# Patient Record
Sex: Female | Born: 1980 | Race: White | Hispanic: No | Marital: Married | Smoking: Never smoker
Health system: Southern US, Community
[De-identification: ages and names within clinical notes are randomized; demographics above are authoritative.]

## PROBLEM LIST (undated history)

## (undated) DIAGNOSIS — F419 Anxiety disorder, unspecified: Secondary | ICD-10-CM

## (undated) DIAGNOSIS — F329 Major depressive disorder, single episode, unspecified: Secondary | ICD-10-CM

## (undated) DIAGNOSIS — F32A Depression, unspecified: Secondary | ICD-10-CM

## (undated) DIAGNOSIS — Z862 Personal history of diseases of the blood and blood-forming organs and certain disorders involving the immune mechanism: Secondary | ICD-10-CM

## (undated) DIAGNOSIS — O009 Unspecified ectopic pregnancy without intrauterine pregnancy: Secondary | ICD-10-CM

## (undated) DIAGNOSIS — D689 Coagulation defect, unspecified: Secondary | ICD-10-CM

## (undated) HISTORY — DX: Personal history of diseases of the blood and blood-forming organs and certain disorders involving the immune mechanism: Z86.2

## (undated) HISTORY — DX: Coagulation defect, unspecified: D68.9

## (undated) HISTORY — PX: OTHER SURGICAL HISTORY: SHX169

---

## 2011-05-20 ENCOUNTER — Emergency Department: Payer: Self-pay | Admitting: Internal Medicine

## 2011-07-04 ENCOUNTER — Emergency Department: Payer: Self-pay | Admitting: Emergency Medicine

## 2011-07-04 LAB — COMPREHENSIVE METABOLIC PANEL
Albumin: 4 g/dL (ref 3.4–5.0)
BUN: 12 mg/dL (ref 7–18)
Bilirubin,Total: 0.4 mg/dL (ref 0.2–1.0)
Chloride: 103 mmol/L (ref 98–107)
Creatinine: 0.54 mg/dL — ABNORMAL LOW (ref 0.60–1.30)
EGFR (Non-African Amer.): >60
Glucose: 94 mg/dL (ref 65–99)
Osmolality: 275 (ref 275–301)
Potassium: 3.7 mmol/L (ref 3.5–5.1)
SGOT(AST): 16 U/L (ref 15–37)
SGPT (ALT): 23 U/L
Sodium: 138 mmol/L (ref 136–145)

## 2011-07-04 LAB — URINALYSIS, COMPLETE
Bacteria: NONE SEEN
Glucose,UR: NEGATIVE mg/dL (ref 0–75)
Ketone: NEGATIVE
Leukocyte Esterase: NEGATIVE
Nitrite: NEGATIVE
Protein: NEGATIVE
RBC,UR: <1 /HPF (ref 0–5)
Specific Gravity: 1.008 (ref 1.003–1.030)
Squamous Epithelial: <1

## 2011-07-04 LAB — PREGNANCY, URINE: Pregnancy Test, Urine: POSITIVE m[IU]/mL

## 2011-07-04 LAB — CBC
HCT: 37.4 % (ref 35.0–47.0)
HGB: 12.4 g/dL (ref 12.0–16.0)
MCH: 30 pg (ref 26.0–34.0)
MCHC: 33.1 g/dL (ref 32.0–36.0)
Platelet: 219 10*3/uL (ref 150–440)
RDW: 13.6 % (ref 11.5–14.5)

## 2011-07-04 LAB — HCG, QUANTITATIVE, PREGNANCY: Beta Hcg, Quant.: 1316 m[IU]/mL — ABNORMAL HIGH

## 2011-07-09 ENCOUNTER — Ambulatory Visit: Payer: Self-pay | Admitting: Obstetrics and Gynecology

## 2011-07-09 HISTORY — PX: LAPAROSCOPIC OVARIAN CYSTECTOMY: SUR786

## 2011-07-14 LAB — PATHOLOGY REPORT

## 2011-11-04 ENCOUNTER — Emergency Department: Payer: Self-pay | Admitting: Emergency Medicine

## 2012-04-19 ENCOUNTER — Observation Stay: Payer: Self-pay | Admitting: Obstetrics & Gynecology

## 2012-04-19 LAB — RUPTURE OF MEMBRANE PLUS: Rom Plus: NOT DETECTED

## 2012-04-20 ENCOUNTER — Inpatient Hospital Stay: Payer: Self-pay | Admitting: Obstetrics and Gynecology

## 2012-04-20 LAB — CBC WITH DIFFERENTIAL/PLATELET
Basophil #: 0.1 10*3/uL (ref 0.0–0.1)
HCT: 35.6 % (ref 35.0–47.0)
Lymphocyte %: 13.7 %
MCH: 31.3 pg (ref 26.0–34.0)
MCV: 92 fL (ref 80–100)
Neutrophil #: 11.6 10*3/uL — ABNORMAL HIGH (ref 1.4–6.5)
Neutrophil %: 81.6 %
Platelet: 212 10*3/uL (ref 150–440)
RDW: 13.2 % (ref 11.5–14.5)
WBC: 14.3 10*3/uL — ABNORMAL HIGH (ref 3.6–11.0)

## 2012-04-21 LAB — HEMATOCRIT: HCT: 29.3 % — ABNORMAL LOW

## 2012-07-05 DIAGNOSIS — F4321 Adjustment disorder with depressed mood: Secondary | ICD-10-CM | POA: Insufficient documentation

## 2014-05-20 NOTE — Consult Note (Signed)
Brief Consult Note: Diagnosis: Pregnancy of undetermined location.   Patient was seen by consultant.   Consult note dictated.   Recommend further assessment or treatment.   Discussed with Attending MD.   Comments: 34 yo G1 at 2025w6d by sure LMP of 05/11/2011 presenting with vaginal bleeding and cramping - TVUS showing no IUP, with read showing possible adnexal mass/cyst, BHCG below discriminatory zone - After discussion of managment options with patients including sugical intervention we will proceed with repeat BHCG in 48-hrs and repeat TVUS.  Electronic Signatures: Lorrene ReidStaebler, Martez Weiand M (MD)  (Signed 09-Jun-13 03:59)  Authored: Brief Consult Note   Last Updated: 09-Jun-13 03:59 by Lorrene ReidStaebler, Bakary Bramer M (MD)

## 2014-05-20 NOTE — Op Note (Signed)
PATIENT NAME:  Beverly Daniels, Beverly Daniels MR#:  [id number] DATE OF BIRTH:  November 23, 1980  DATE OF PROCEDURE:  07/09/2011  PREOPERATIVE DIAGNOSES: Left ectopic pregnancy and possible left ovarian cyst.   POSTOPERATIVE DIAGNOSES: Left ectopic pregnancy and possible left ovarian cyst.  PROCEDURE PERFORMED: Laparoscopic left salpingectomy and left ovarian cystectomy.   PRIMARY SURGEON: Florina Ou. Bonney Aid, MD  ASSISTANT: Dr. Senaida Lange   ANESTHESIA: General.   ESTIMATED BLOOD LOSS: Minimal.   IV FLUIDS: 700 mL of crystalloid.   URINE OUTPUT: 50 mL of clear urine straight catheter prior to beginning of the case.   SPECIMENS REMOVED: Portion of left tube containing ectopic pregnancy as well as left ovarian cyst wall.   IMPLANTS: None.   DRAINS OR TUBES: None.   INTRAOPERATIVE FINDINGS: Large left fallopian tube ectopic pregnancy approximately 5 x 6 cm. A large simple left ovarian cyst as well as two smaller cysts, one with sebaceous material likely representing a functional or serous cyst as well as possible early dermoid. There was no evidence of hemoperitoneum at the time of surgery.   PATIENT CONDITION FOLLOWING PROCEDURE: Stable.   PROCEDURE IN DETAIL: Risks, benefits, and alternatives of the procedure were discussed with the patient prior to proceeding to the Operating Room. The patient was taken to the Operating Room, placed under general endotracheal anesthesia. She was positioned in the dorsal lithotomy position and prepped and draped in the usual sterile fashion. Prior to beginning of the case the patient's bladder was emptied for approximately 50 mL of clear urine using a straight catheter. A sterile speculum was then placed. The anterior lip of the cervix was grasped with a single-tooth tenaculum and a Hulka tenaculum was then placed in the patient's uterus. Following this the single-tooth tenaculum was removed as was the speculum and attention was turned to the patient's abdomen. A  5 mm stab incision was made at the base of the umbilicus and a 5 mm XL trocar was then used to gain entry into the peritoneal cavity under direct visualization. Once the peritoneal cavity had been entered, insufflation was begun. A 5 mm left lateral assistant port was then placed under direct visualization as well as a 10 mm right lateral assistant port. Inspection of the abdomen revealed a large left fallopian tube ectopic pregnancy which involved almost the entire fallopian tube. The ectopic was nonruptured although and there was no noted hemoperitoneum at the time of entry. Given the size of the ectopic it was decided to proceed with salpingectomy. Salpingectomy was performed using a 5 mm Harmonic device. Following removal of the left tube and ectopic the pedicles were inspected and noted to be hemostatic. The remainder of the pelvic anatomy as well as abdominal anatomy was inspected. The liver edge was grossly normal. There is no evidence of [employer] or endometriosis lesions. The right tube and ovary appeared grossly normal with normal fimbria noted on the right tube. The left ovary was then inspected closer and noted to contain a large simple-appearing cyst approximately 4 cm in diameter. The cyst was incised using a 5 mm LigaSure device. The cyst was noted to drain clear serous fluid. The cyst wall was then dissected and excised. Following removal of the left ovarian cyst wall two smaller cysts were noted which were incised and drained, one of which drained sebaceous-looking material likely representing an early dermoid. At this point an EndoCatch bag was deployed through the 10 mm right assistant port. The ectopic pregnancy was removed using the EndoCatch bag.  Following removal of the ectopic the  pelvis was irrigated and all pedicles were reinspected and noted to be hemostatic. The ovarian fossa was also inspected and noted to be hemostatic. Next, the 10 mm port site was closed using a laparoscopic  closure device. Following closure of the 10 mm port pneumoperitoneum was evacuated and the left assistant ports were removed under direct visualization. The 10 mm port site was closed at the level of the skin with a 4-0 Monocryl in a subcuticular fashion. All incision sites were then dressed with Dermabond. Sponge, needle, and instrument counts were correct x2. The patient tolerated procedure well and was taken back to the recovery room in stable condition.   ____________________________ Florina OuAndreas M. Bonney AidStaebler, MD ams:cms D: 07/09/2011 19:10:53 ET T: 07/10/2011 08:32:10 ET  JOB#: [id number] cc: Florina OuAndreas M. Bonney AidStaebler, MD, <Dictator> Carmel SacramentoANDREAS Cathrine MusterM Korina Tretter MD ELECTRONICALLY SIGNED 07/22/2011 23:47

## 2014-05-20 NOTE — Consult Note (Signed)
Consulting Department: Emergency Department Consulting Physician: Bayard MalesBrown, [place] MD  Consulting Question: Evaluate for further management, pregnancy of uncertain location  History of Present Illness: The patient is a 34 year old G2P1001 at 7 weeks 6 days gestation by LMP of 05/11/2011 presenting after heavy vaginal bleeding yesterday that has since subsided.  She did pass some small clots during that episode, had some midline pelvic pain described as cramping.  The bleeding is now down to spotting, cramping has abated.  She reports that her menses are regular monthly but the interval varies and may be up to 35 days.  In addition she reports a missed menses in February of 2013.  The patient has no history of gonorrhea, chlamydia, or PID.  She did not conceive on OCP's nor did she conceive with the help of ART. This is a desired pregnancy for the patient.  Review of Systems: 10 point review of systems negative unless otherwise noted in HPI Medical History: none  Past Surgical History: none  Past Gynecologic History: no history of STI or abnormal paps  Past Obstetric History: G2P1001, TSVD x 1 uncomplicated  Family History: non-contributory  Social History: no tobacco, EtOH, or illicit drug use  Medications: none  Allergies: NKDA Exam: Afebrile, BP 109/55, HR 88, RR 18, O2sat 98% RA General: NAD HEENT: normocephalic anicteric Abdomen: NABS, soft, non-tender, non-distended, no rebound, no guarding Pelvic: Deferred Extremities: No edema, pedal pulses 2+ bilaterally Neurologic: grossly intact  Laboratory: BHCG: 1316 Blood Type: O positive CBC: WBC 4.9, Hgb 12.4, Hct 37.4, Platelets 219  Ultrasound: report not available for review by time of consult but per ER physicain no IUP, no definitive ectopic but left ovarian cyst with ectopic unable to be excluded.  Images are reviewed personally, showing a non-enlarged, midline, anteverted uterus, EMS 3.462mm no decidual reactions or fluid in  endometrial cavity, no free CDS fluid.  Right adenxa images normally.  The left ovary contains what appear to be a corpous luteum cyst and several smaller follicles.  On at least one image one of the cysts appeas that it could be periovarian and is thick walled.  Assesment: 34 yo G2P1001 with pregnanc of undetermined location  Plan - This is a desired pregnancy, discussed diagnostic laparoscopy with D&C if no definitive ectopic during procedure vs treatment with methotrexate vs repeat BHCG in 48-hrs with TVUS for follow up in clinic to assess for early developin IUP and evalution of left adnexa.  As this is a desired pregnancy the patient opts for repeat BHCG for further evaluation.  I did discuss that her BHCG is below the disriminatory zone for and that it would be possible to provide the patient with more definitive diagnosis as to possible completed abortion, ectopic pregnancy, or early IUP based on on this repeat evaluation. - Blood type is O positive - Given routine precautions and told to represent if patient has worsening abdominal pain or heavy vaginal bleeding soaking more than 1 pad an hour   Electronic Signatures: Lorrene ReidStaebler, Kadesia Robel M (MD)  (Signed on 09-Jun-13 04:29)  Authored  Last Updated: 09-Jun-13 04:29 by Lorrene ReidStaebler, Yida Hyams M (MD)

## 2014-06-05 NOTE — H&P (Signed)
L&D Evaluation:  History Expanded:  HPI 34 yo G3 P1011 (ectopic, Normal Spontaneous Vaginal Delivery) w estimated date of confinement 05/10/12 w contractions and show.  Seen earlier and was 4/50, now feels cervix may have changed as symptoms have worsened.   Blood Type (Maternal) O positive   Maternal Varicella Immune   Rubella Results (Maternal) immune   Presents with contractions   Patient's Medical History No Chronic Illness   Patient's Surgical History Lap left salpingectomy   Medications Pre Natal Vitamins   Allergies NKDA   Social History none   Family History Non-Contributory   ROS:  ROS All systems were reviewed.  HEENT, CNS, GI, GU, Respiratory, CV, Renal and Musculoskeletal systems were found to be normal.   Exam:  Vital Signs stable   General no apparent distress   Mental Status clear   Abdomen gravid, tender with contractions   Estimated Fetal Weight Average for gestational age   Back no CVAT   Edema no edema   Pelvic no external lesions, 8/100/-1, BBOW   Mebranes Intact   FHT normal rate with no decels   FHT Description Cat I   Ucx regular   Skin dry   Impression:  Impression active labor   Plan:  Plan EFM/NST, monitor contractions and for cervical change, antibiotics for GBBS prophylaxis   Comments Wants epidural Labor progressing quickly ABX for Group B Beta Strep.   Electronic Signatures: Letitia LibraHarris, Leelynn Whetsel Paul (MD)  (Signed 26-Mar-14 07:04)  Authored: L&D Evaluation   Last Updated: 26-Mar-14 07:04 by Letitia LibraHarris, Joei Frangos Paul (MD)

## 2015-06-12 ENCOUNTER — Emergency Department
Admission: EM | Admit: 2015-06-12 | Discharge: 2015-06-12 | Disposition: A | Discharge status: Home / Self Care | Payer: Self-pay | Attending: Student | Admitting: Student

## 2015-06-12 ENCOUNTER — Emergency Department: Payer: Self-pay

## 2015-06-12 DIAGNOSIS — L309 Dermatitis, unspecified: Secondary | ICD-10-CM | POA: Insufficient documentation

## 2015-06-12 DIAGNOSIS — B86 Scabies: Secondary | ICD-10-CM | POA: Insufficient documentation

## 2015-06-12 DIAGNOSIS — R51 Headache: Secondary | ICD-10-CM | POA: Insufficient documentation

## 2015-06-12 MED ORDER — SODIUM CHLORIDE 0.9 % IV BOLUS (SEPSIS)
1000.0000 mL | Freq: Once | INTRAVENOUS | Status: AC
  Administered 2015-06-12: 1000 mL via INTRAVENOUS

## 2015-06-12 MED ORDER — RANITIDINE HCL 150 MG PO TABS: 150.0000 mg | ORAL_TABLET | Freq: Two times a day (BID) | ORAL | Status: DC

## 2015-06-12 MED ORDER — METHYLPREDNISOLONE SODIUM SUCC 125 MG IJ SOLR
125.0000 mg | Freq: Once | INTRAMUSCULAR | Status: AC
  Administered 2015-06-12: 125 mg via INTRAMUSCULAR
  Filled 2015-06-12: qty 2

## 2015-06-12 MED ORDER — PERMETHRIN 1 % EX LOTN: TOPICAL_LOTION | CUTANEOUS | Status: DC

## 2015-06-12 MED ORDER — HYDROXYZINE PAMOATE 25 MG PO CAPS: 25.0000 mg | ORAL_CAPSULE | Freq: Three times a day (TID) | ORAL | Status: DC | PRN

## 2015-06-12 NOTE — ED Notes (Signed)
Pt calling husband to leave message

## 2015-06-12 NOTE — ED Notes (Signed)
Pt in with rash x 1 week worse to feet and ankles.

## 2015-06-12 NOTE — ED Provider Notes (Signed)
[facility] Emergency Department Provider Note  ____________________________________________  Time seen: Approximately 8:19 PM  I have reviewed the triage vital signs and the nursing notes.   HISTORY  Chief Complaint Rash    HPI Beverly Daniels is a 35 y.o. female who presents for evaluation of a rash times one week. Patient states it may be scabies or may be an allergic reaction she is unsure but she knows that she is itching a lot. Has not taken any medications over-the-counter other than Claritin 10 mg daily. No other family members noted to have a rash.   No past medical history on file.  There are no active problems to display for this patient.   No past surgical history on file.  Current Outpatient Rx  Name  Route  Sig  Dispense  Refill  . hydrOXYzine (VISTARIL) 25 MG capsule   Oral   Take 1 capsule (25 mg total) by mouth 3 (three) times daily as needed for itching.   30 capsule   0   . permethrin (PERMETHRIN LICE TREATMENT) 1 % lotion      Apply to affected area once   60 mL   1   . ranitidine (ZANTAC) 150 MG tablet   Oral   Take 1 tablet (150 mg total) by mouth 2 (two) times daily.   14 tablet   1     Allergies Review of patient's allergies indicates no known allergies.  No family history on file.  Social History Social History  Substance Use Topics  . Smoking status: Not on file  . Smokeless tobacco: Not on file  . Alcohol Use: Not on file    Review of Systems Constitutional: No fever/chills Eyes: No visual changes. ENT: No sore throat. Cardiovascular: Denies chest pain. Respiratory: Denies shortness of breath. Gastrointestinal: No abdominal pain.  No nausea, no vomiting.  No diarrhea.  No constipation. Genitourinary: Negative for dysuria. Musculoskeletal: Negative for back pain. Skin: Positive for rash. Neurological: Negative for headaches, focal weakness or numbness.  10-point ROS otherwise  negative.  ____________________________________________   PHYSICAL EXAM:  VITAL SIGNS: ED Triage Vitals  Enc Vitals Group     BP 06/12/15 2010 114/85 mmHg     Pulse Rate 06/12/15 2010 77     Resp 06/12/15 2010 20     Temp 06/12/15 2010 98.4 F (36.9 C)     Temp Source 06/12/15 2010 Oral     SpO2 06/12/15 2010 98 %     Weight 06/12/15 2010 137 lb (62.143 kg)     Height 06/12/15 2010 5\' 3"  (1.6 m)     Head Cir --      Peak Flow --      Pain Score --      Pain Loc --      Pain Edu? --      Excl. in GC? --     Constitutional: Alert and oriented. Well appearing and in no acute distress. Eyes: Conjunctivae are normal. PERRL. EOMI. Head: Atraumatic. Nose: No congestion/rhinnorhea. Mouth/Throat: Mucous membranes are moist.  Oropharynx non-erythematous. Neck: No stridor.   Cardiovascular: Normal rate, regular rhythm. Grossly normal heart sounds.  Good peripheral circulation. Respiratory: Normal respiratory effort.  No retractions. Lungs CTAB. Gastrointestinal: Soft and nontender. No distention. No abdominal bruits. No CVA tenderness. Musculoskeletal: No lower extremity tenderness nor edema.  No joint effusions. Neurologic:  Normal speech and language. No gross focal neurologic deficits are appreciated. No gait instability. Skin:  Skin is warm,  dry and intact. Rash noted throughout the body legs trunk and arms hands. Some vesicles some scabbing. Consistent with ischemia former atopic dermatitis. Psychiatric: Mood and affect are normal. Speech and behavior are normal.  ____________________________________________   LABS (all labs ordered are listed, but only abnormal results are displayed)  Labs Reviewed - No data to display ____________________________________________  EKG  Normal sinus rhythm no acute STEMI or changes. ____________________________________________  RADIOLOGY  FINDINGS: Ventricles, cisterns and other CSF spaces are within normal. There is no mass, mass  effect, shift of midline structures or acute hemorrhage. No evidence of acute infarction. Small focal region of soft tissue swelling over the right occipital region. The remaining bony structures are within normal.  IMPRESSION: No acute intracranial findings. ____________________________________________   PROCEDURES  Procedure(s) performed: None  Critical Care performed: No  ____________________________________________   INITIAL IMPRESSION / ASSESSMENT AND PLAN / ED COURSE  Pertinent labs & imaging results that were available during my care of the patient were reviewed by me and considered in my medical decision making (see chart for details).  Initial impression was dermatitis with scabies infestation. Patient was given site Medrol 125 mg IM and subsequently had vasovagal response. Patient then received a liter of normal saline vital signs returned to normal patient feeling slightly lightheaded with a normal head CT discharged home with driver. She voices no other emergency medical complaints at this time. ____________________________________________   FINAL CLINICAL IMPRESSION(S) / ED DIAGNOSES  Final diagnoses:  Dermatitis  Scabies infestation    Addendum: 2100pm  Patient had a vasovagal response immediately after receiving her Solu-Medrol injection. Patient reports that she was feeling like she was going pass out so she got out of bed to call for help when I witnessed her falling down hitting her head. Initially unresponsive verbal painful stimuli but within a few minutes she was answering questions appropriately. She was placed in a c-collar on the floor. Reassessment determined that she had no cervical pain but did have severe head pain where she hit the floor. Patient wished initially not responsive to verbal stimuli. Within 2 minutes patient did become responsive to verbal questioning. IV normal saline and head CT ordered.  Eyes: Conjunctivae are normal. PERRL.  EOMI. Head: Positive for contusion to the posterior aspect of the head Nose: No congestion/rhinnorhea. Mouth/Throat: Mucous membranes are moist.  Oropharynx non-erythematous. Neck: No stridor. No point tenderness with full range of motion. Cardiovascular: Slightly bradycardic rate, regular rhythm. Grossly normal heart sounds.  Good peripheral circulation. Respiratory: Normal respiratory effort.  No retractions. Lungs CTAB.    This chart was dictated using voice recognition software/Dragon. Despite best efforts to proofread, errors can occur which can change the meaning. Any change was purely unintentional.   Evangeline Dakin, PA-C 06/12/15 2206  Gayla Doss, MD 06/12/15 2350

## 2015-06-12 NOTE — ED Notes (Signed)
AAOx3.  Skin warm and dry. Ambulated with easy and steady gait. Denies c/o dizziness.  D/C in Wheelchair with spouse.

## 2015-06-12 NOTE — Discharge Instructions (Signed)

## 2015-06-12 NOTE — ED Notes (Signed)
PA at the bedside to assess.  

## 2015-06-14 ENCOUNTER — Encounter: Payer: Self-pay | Admitting: Emergency Medicine

## 2015-06-14 ENCOUNTER — Emergency Department
Admission: EM | Admit: 2015-06-14 | Discharge: 2015-06-14 | Disposition: A | Discharge status: Home / Self Care | Payer: Self-pay | Attending: Emergency Medicine | Admitting: Emergency Medicine

## 2015-06-14 DIAGNOSIS — F410 Panic disorder [episodic paroxysmal anxiety] without agoraphobia: Secondary | ICD-10-CM | POA: Insufficient documentation

## 2015-06-14 DIAGNOSIS — F419 Anxiety disorder, unspecified: Secondary | ICD-10-CM | POA: Insufficient documentation

## 2015-06-14 HISTORY — DX: Anxiety disorder, unspecified: F41.9

## 2015-06-14 MED ORDER — CLONAZEPAM 0.5 MG PO TABS: 0.2500 mg | ORAL_TABLET | Freq: Two times a day (BID) | ORAL | Status: DC

## 2015-06-14 NOTE — Discharge Instructions (Signed)
Panic Attacks Panic attacks are sudden, short feelings of great fear or discomfort. You may have them for no reason when you are relaxed, when you are uneasy (anxious), or when you are sleeping.  HOME CARE  Take all your medicines as told.  Check with your doctor before starting new medicines.  Keep all doctor visits. GET HELP IF:  You are not able to take your medicines as told.  Your symptoms do not get better.  Your symptoms get worse. GET HELP RIGHT AWAY IF:  Your attacks seem different than your normal attacks.  You have thoughts about hurting yourself or others.  You take panic attack medicine and you have a side effect. MAKE SURE YOU:  Understand these instructions.  Will watch your condition.  Will get help right away if you are not doing well or get worse.   This information is not intended to replace advice given to you by your health care provider. Make sure you discuss any questions you have with your health care provider.   Document Released: 02/14/2010 Document Revised: 11/02/2012 Document Reviewed: 08/26/2012 Elsevier Interactive Patient Education 2016 [employer]   Use the Klonopin 1 pill twice a day for 3 or 4 days. If that is not enough please return or follow-up with [facility] or you can see [facility], [facility], [facility], or [facility]. Please be careful not to get pregnant on the Klonopin.

## 2015-06-14 NOTE — ED Provider Notes (Signed)
[facility] Emergency Department Provider Note   ____________________________________________  Time seen: Approximately 5:24 PM  I have reviewed the triage vital signs and the nursing notes.   HISTORY  Chief Complaint Anxiety    HPI Beverly Daniels is a 35 y.o. female she reports she was here 2 days ago and got a steroid shot. The steroids made her very anxious she's been jittery all day long and unable to sleep all night long last 2 nights. She has tried Unisom at home with without excess better fact patient says Unisom x-ray made her more jittery. She said it made her feel exhausted but unable to sleep. Patient reports she's often gets Klonopin when she has panic or anxiety attacks but has no Klonopin at home. She has some along the steroid shot might last period patient has no other complaints and her rash is better.  Past Medical History  Diagnosis Date  . Anxiety     There are no active problems to display for this patient.   History reviewed. No pertinent past surgical history.  Current Outpatient Rx  Name  Route  Sig  Dispense  Refill  . clonazePAM (KLONOPIN) 0.5 MG tablet   Oral   Take 0.5 tablets (0.25 mg total) by mouth 2 (two) times daily.   8 tablet   0   . hydrOXYzine (VISTARIL) 25 MG capsule   Oral   Take 1 capsule (25 mg total) by mouth 3 (three) times daily as needed for itching.   30 capsule   0   . permethrin (PERMETHRIN LICE TREATMENT) 1 % lotion      Apply to affected area once   60 mL   1   . ranitidine (ZANTAC) 150 MG tablet   Oral   Take 1 tablet (150 mg total) by mouth 2 (two) times daily.   14 tablet   1     Allergies Review of patient's allergies indicates no known allergies.  No family history on file.  Social History Social History  Substance Use Topics  . Smoking status: Never Smoker   . Smokeless tobacco: None  . Alcohol Use: None    Review of Systems Constitutional: No  fever/chills Eyes: No visual changes. ENT: No sore throat. Cardiovascular: Denies chest pain. Respiratory: Denies shortness of breath. Gastrointestinal: No abdominal pain.  No nausea, no vomiting.  No diarrhea.  No constipation. Genitourinary: Negative for dysuria. Musculoskeletal: Negative for back pain. Skin: Negative for rash.  10-point ROS otherwise negative.  ____________________________________________   PHYSICAL EXAM:  VITAL SIGNS: ED Triage Vitals  Enc Vitals Group     BP 06/14/15 0812 134/77 mmHg     Pulse Rate 06/14/15 0812 95     Resp 06/14/15 0812 18     Temp 06/14/15 0812 97.8 F (36.6 C)     Temp Source 06/14/15 0812 Oral     SpO2 06/14/15 0812 100 %     Weight 06/14/15 0812 135 lb (61.236 kg)     Height 06/14/15 0812  (1.626 m)     Head Cir --      Peak Flow --      Pain Score 06/14/15 0801 0     Pain Loc --      Pain Edu? --      Excl. in GC? --     Constitutional: Alert and oriented. Well appearing and in no acute distress. Eyes: Conjunctivae are normal. PERRL. EOMI. Head: Atraumatic. Nose: No congestion/rhinnorhea. Mouth/Throat:  Mucous membranes are moist.  Oropharynx non-erythematous. Neck: No stridor.   Cardiovascular: Normal rate, regular rhythm. Grossly normal heart sounds.  Good peripheral circulation. Respiratory: Normal respiratory effort.  No retractions. Lungs CTAB. Gastrointestinal: Soft and nontender. No distention. No abdominal bruits. No CVA tenderness. Musculoskeletal: No lower extremity tenderness nor edema.  No joint effusions. Neurologic:  Normal speech and language. No gross focal neurologic deficits are appreciated. No gait instability. Skin:  Skin is warm, dry and intact. No rash noted. _______________________________________   LABS (all labs ordered are listed, but only abnormal results are displayed)  Labs Reviewed - No data to  display ____________________________________________  EKG   ____________________________________________  RADIOLOGY   ____________________________________________   PROCEDURES    ____________________________________________   INITIAL IMPRESSION / ASSESSMENT AND PLAN / ED COURSE  Pertinent labs & imaging results that were available during my care of the patient were reviewed by me and considered in my medical decision making (see chart for details). After waiting in the ER for a while patient feels better we'll give her some Klonopin to go home with for 4 days she is to return if she is worse. ________________________________________   FINAL CLINICAL IMPRESSION(S) / ED DIAGNOSES  Final diagnoses:  Anxiety  Panic      NEW MEDICATIONS STARTED DURING THIS VISIT:  Discharge Medication List as of 06/14/2015  9:30 AM    START taking these medications   Details  clonazePAM (KLONOPIN) 0.5 MG tablet Take 0.5 tablets (0.25 mg total) by mouth 2 (two) times daily., Starting 06/14/2015, Until Sat 06/13/16, Print         Note:  This document was prepared using Dragon voice recognition software and may include unintentional dictation errors.    Arnaldo NatalPaul F Malinda, MD 06/14/15 1726

## 2015-06-14 NOTE — ED Notes (Signed)
Pt states she received a cortisone shot 2 days ago and feels like it has made her anxiety worse and unable to sleep.  NAD

## 2015-09-03 LAB — OB RESULTS CONSOLE RPR: RPR: NONREACTIVE

## 2015-09-03 LAB — OB RESULTS CONSOLE HGB/HCT, BLOOD
HEMATOCRIT: 37 %
HEMOGLOBIN: 12.3 g/dL

## 2015-09-03 LAB — OB RESULTS CONSOLE HIV ANTIBODY (ROUTINE TESTING): HIV: NONREACTIVE

## 2015-09-03 LAB — OB RESULTS CONSOLE ABO/RH: RH TYPE: POSITIVE

## 2015-09-03 LAB — OB RESULTS CONSOLE GC/CHLAMYDIA
CHLAMYDIA, DNA PROBE: NEGATIVE
GC PROBE AMP, GENITAL: NEGATIVE

## 2015-09-03 LAB — OB RESULTS CONSOLE RUBELLA ANTIBODY, IGM: RUBELLA: IMMUNE

## 2015-09-03 LAB — OB RESULTS CONSOLE HEPATITIS B SURFACE ANTIGEN: Hepatitis B Surface Ag: NEGATIVE

## 2015-09-03 LAB — HM PAP SMEAR: HM Pap smear: NEGATIVE

## 2015-09-03 LAB — OB RESULTS CONSOLE PLATELET COUNT: PLATELETS: 226 10*3/uL

## 2015-09-03 LAB — OB RESULTS CONSOLE ANTIBODY SCREEN: Antibody Screen: NEGATIVE

## 2015-09-03 LAB — OB RESULTS CONSOLE VARICELLA ZOSTER ANTIBODY, IGG: Varicella: IMMUNE

## 2016-01-27 NOTE — L&D Delivery Note (Signed)
Delivery Note At 10:05 PM a viable female infant was delivered via Vaginal, Spontaneous Delivery (Presentation: ROA).  APGAR: 8, 9; weight  pending.   Placenta status: delivered spontaneously, intact.  Cord: 3VC, with the following complications: None.  Cord pH: n/a  Anesthesia: epidural  Episiotomy: None Lacerations: None Suture Repair: n/a Est. Blood Loss (mL): 300  Mom to postpartum.  Baby to Couplet care / Skin to Skin.  Called to see patient.  Mom pushed to deliver a viable female infant.  The head followed by shoulders, which delivered without difficulty, and the rest of the body.  No nuchal cord noted.  Baby to mom's chest.  Cord clamped and cut after > 1 min delay.  Cord blood obtained.  Placenta delivered spontaneously, intact, with a 3-vessel cord.  No vaginal, cervical, or perineal lacerations. All counts correct.  Hemostasis obtained with IV pitocin and fundal massage. EBL 300 mL.     Thomasene MohairStephen Jordy Hewins, MD 04/11/2016, 10:21 PM

## 2016-02-20 LAB — OB RESULTS CONSOLE PLATELET COUNT: PLATELETS: 214 10*3/uL

## 2016-02-20 LAB — OB RESULTS CONSOLE HIV ANTIBODY (ROUTINE TESTING): HIV: NONREACTIVE

## 2016-02-20 LAB — OB RESULTS CONSOLE TSH: TSH: 2.23

## 2016-02-20 LAB — OB RESULTS CONSOLE HGB/HCT, BLOOD
HCT: 31 %
Hemoglobin: 10.2 g/dL

## 2016-02-20 LAB — OB RESULTS CONSOLE RPR: RPR: NONREACTIVE

## 2016-02-25 DIAGNOSIS — R002 Palpitations: Secondary | ICD-10-CM | POA: Insufficient documentation

## 2016-03-27 LAB — GLUCOSE, 1 HOUR GESTATIONAL
GLUCOSE 1 HOUR: 83
Glucose 1 Hour: 91

## 2016-03-30 ENCOUNTER — Ambulatory Visit (INDEPENDENT_AMBULATORY_CARE_PROVIDER_SITE_OTHER): Payer: Medicaid Other | Admitting: Obstetrics and Gynecology

## 2016-03-30 DIAGNOSIS — Z538 Procedure and treatment not carried out for other reasons: Secondary | ICD-10-CM

## 2016-04-06 ENCOUNTER — Encounter: Payer: Medicaid Other | Admitting: Obstetrics and Gynecology

## 2016-04-08 ENCOUNTER — Encounter: Payer: Medicaid Other | Admitting: Obstetrics & Gynecology

## 2016-04-08 ENCOUNTER — Other Ambulatory Visit: Payer: Self-pay | Admitting: Obstetrics & Gynecology

## 2016-04-08 DIAGNOSIS — Z3A39 39 weeks gestation of pregnancy: Secondary | ICD-10-CM

## 2016-04-11 ENCOUNTER — Inpatient Hospital Stay
Admission: EM | Admit: 2016-04-11 | Discharge: 2016-04-13 | DRG: 775 | Disposition: A | Discharge status: Home / Self Care | Payer: Medicaid Other | Attending: Obstetrics and Gynecology | Admitting: Obstetrics and Gynecology

## 2016-04-11 ENCOUNTER — Inpatient Hospital Stay: Payer: Medicaid Other | Admitting: Anesthesiology

## 2016-04-11 DIAGNOSIS — Z833 Family history of diabetes mellitus: Secondary | ICD-10-CM | POA: Diagnosis not present

## 2016-04-11 DIAGNOSIS — Z3A39 39 weeks gestation of pregnancy: Secondary | ICD-10-CM | POA: Diagnosis not present

## 2016-04-11 DIAGNOSIS — O99824 Streptococcus B carrier state complicating childbirth: Principal | ICD-10-CM | POA: Diagnosis present

## 2016-04-11 DIAGNOSIS — Z3493 Encounter for supervision of normal pregnancy, unspecified, third trimester: Secondary | ICD-10-CM | POA: Diagnosis present

## 2016-04-11 DIAGNOSIS — O9081 Anemia of the puerperium: Secondary | ICD-10-CM | POA: Diagnosis present

## 2016-04-11 DIAGNOSIS — Z880 Allergy status to penicillin: Secondary | ICD-10-CM | POA: Diagnosis not present

## 2016-04-11 HISTORY — DX: Major depressive disorder, single episode, unspecified: F32.9

## 2016-04-11 HISTORY — DX: Unspecified ectopic pregnancy without intrauterine pregnancy: O00.90

## 2016-04-11 HISTORY — DX: Depression, unspecified: F32.A

## 2016-04-11 LAB — CREATININE, SERUM
CREATININE: 0.51 mg/dL (ref 0.44–1.00)
GFR calc Af Amer: >60 mL/min (ref >60)

## 2016-04-11 LAB — CBC
HCT: 36.9 % (ref 35.0–47.0)
HEMOGLOBIN: 12.8 g/dL (ref 12.0–16.0)
MCH: 31.4 pg (ref 26.0–34.0)
MCHC: 34.7 g/dL (ref 32.0–36.0)
MCV: 90.6 fL (ref 80.0–100.0)
Platelets: 231 10*3/uL (ref 150–440)
RBC: 4.08 MIL/uL (ref 3.80–5.20)
RDW: 14.3 % (ref 11.5–14.5)
WBC: 15.5 10*3/uL — ABNORMAL HIGH (ref 3.6–11.0)

## 2016-04-11 LAB — TYPE AND SCREEN
ABO/RH(D): O POS
Antibody Screen: NEGATIVE

## 2016-04-11 MED ORDER — ONDANSETRON HCL 4 MG/2ML IJ SOLN: 4.0000 mg | Freq: Four times a day (QID) | INTRAMUSCULAR | Status: DC | PRN

## 2016-04-11 MED ORDER — OXYTOCIN BOLUS FROM INFUSION
500.0000 mL | Freq: Once | INTRAVENOUS | Status: AC
Start: 2016-04-11 — End: 2016-04-11
  Administered 2016-04-11: 500 mL via INTRAVENOUS

## 2016-04-11 MED ORDER — OXYTOCIN 40 UNITS IN LACTATED RINGERS INFUSION - SIMPLE MED
2.5000 [IU]/h | INTRAVENOUS | Status: DC
  Filled 2016-04-11: qty 1000

## 2016-04-11 MED ORDER — LIDOCAINE HCL (PF) 1 % IJ SOLN
INTRAMUSCULAR | Status: DC | PRN
  Administered 2016-04-11: 3 mL via EPIDURAL
  Administered 2016-04-11: 3 mL via SUBCUTANEOUS

## 2016-04-11 MED ORDER — OXYTOCIN 10 UNIT/ML IJ SOLN
10.0000 [IU] | Freq: Once | INTRAMUSCULAR | Status: DC
Start: 2016-04-11 — End: 2016-04-12

## 2016-04-11 MED ORDER — LACTATED RINGERS IV SOLN
INTRAVENOUS | Status: DC
  Administered 2016-04-11: 18:00:00 via INTRAVENOUS

## 2016-04-11 MED ORDER — LACTATED RINGERS IV SOLN: 500.0000 mL | INTRAVENOUS | Status: DC | PRN

## 2016-04-11 MED ORDER — SODIUM CHLORIDE 0.9 % IV SOLN
INTRAVENOUS | Status: DC | PRN
  Administered 2016-04-11 (×2): 5 mL via EPIDURAL

## 2016-04-11 MED ORDER — FENTANYL CITRATE (PF) 100 MCG/2ML IJ SOLN
50.0000 ug | INTRAMUSCULAR | Status: AC
  Administered 2016-04-11: 50 ug via INTRAVENOUS
  Filled 2016-04-11: qty 2

## 2016-04-11 MED ORDER — MISOPROSTOL 200 MCG PO TABS
ORAL_TABLET | ORAL | Status: AC
  Filled 2016-04-11: qty 4

## 2016-04-11 MED ORDER — OXYTOCIN 10 UNIT/ML IJ SOLN
INTRAMUSCULAR | Status: AC
  Filled 2016-04-11: qty 2

## 2016-04-11 MED ORDER — FENTANYL 2.5 MCG/ML W/ROPIVACAINE 0.2% IN NS 100 ML EPIDURAL INFUSION (ARMC-ANES)
EPIDURAL | Status: DC | PRN
  Administered 2016-04-11: 10 mL/h via EPIDURAL

## 2016-04-11 MED ORDER — FENTANYL 2.5 MCG/ML W/ROPIVACAINE 0.2% IN NS 100 ML EPIDURAL INFUSION (ARMC-ANES)
EPIDURAL | Status: AC
  Filled 2016-04-11: qty 100

## 2016-04-11 MED ORDER — SOD CITRATE-CITRIC ACID 500-334 MG/5ML PO SOLN
30.0000 mL | ORAL | Status: DC | PRN
  Filled 2016-04-11: qty 30

## 2016-04-11 MED ORDER — AMMONIA AROMATIC IN INHA
RESPIRATORY_TRACT | Status: AC
  Filled 2016-04-11: qty 10

## 2016-04-11 MED ORDER — VANCOMYCIN HCL IN DEXTROSE 1-5 GM/200ML-% IV SOLN
1000.0000 mg | Freq: Two times a day (BID) | INTRAVENOUS | Status: DC
  Administered 2016-04-11: 1000 mg via INTRAVENOUS
  Filled 2016-04-11 (×2): qty 200

## 2016-04-11 MED ORDER — LIDOCAINE HCL (PF) 1 % IJ SOLN: 30.0000 mL | INTRAMUSCULAR | Status: DC | PRN

## 2016-04-11 MED ORDER — LIDOCAINE HCL (PF) 1 % IJ SOLN
INTRAMUSCULAR | Status: AC
  Filled 2016-04-11: qty 30

## 2016-04-11 NOTE — Anesthesia Preprocedure Evaluation (Addendum)
Anesthesia Evaluation  Patient identified by MRN, date of birth, ID band Patient awake    Reviewed: Allergy & Precautions, H&P , NPO status , Patient's Chart, lab work & pertinent test results, reviewed documented beta blocker date and time   History of Anesthesia Complications Negative for: history of anesthetic complications  Airway Mallampati: II  TM Distance: >3 FB Neck ROM: full    Dental   Pulmonary neg pulmonary ROS,           Cardiovascular Exercise Tolerance: Good negative cardio ROS       Neuro/Psych PSYCHIATRIC DISORDERS (Depression and anxiety) negative neurological ROS     GI/Hepatic negative GI ROS, Neg liver ROS,   Endo/Other  negative endocrine ROS  Renal/GU negative Renal ROS  negative genitourinary   Musculoskeletal Scoliosis   Abdominal   Peds  Hematology negative hematology ROS (+)   Anesthesia Other Findings Past Medical History: No date: Anxiety No date: Depression No date: Ectopic pregnancy   Reproductive/Obstetrics (+) Pregnancy                            Anesthesia Physical Anesthesia Plan  ASA: II  Anesthesia Plan: General and Epidural   Post-op Pain Management:    Induction:   Airway Management Planned:   Additional Equipment:   Intra-op Plan:   Post-operative Plan:   Informed Consent: I have reviewed the patients History and Physical, chart, labs and discussed the procedure including the risks, benefits and alternatives for the proposed anesthesia with the patient or authorized representative who has indicated his/her understanding and acceptance.   Dental Advisory Given  Plan Discussed with: Anesthesiologist, CRNA and Surgeon  Anesthesia Plan Comments:         Anesthesia Quick Evaluation

## 2016-04-11 NOTE — OB Triage Note (Signed)
Patient presented to L&D complaining of contractions that started this morning around 1:30 am. Patient states she is having some bloody discharge and fetal movement is less than usual.

## 2016-04-11 NOTE — Discharge Summary (Signed)
OB Discharge Summary     Patient Name: Beverly Daniels DOB: 1980/02/03 MRN: [id number]  Date of admission: 04/11/2016 Delivering MD: Thomasene MohairStephen Jackson, MD  Date of Delivery: 04/11/2016  Date of discharge: 04/13/2016 Admitting diagnosis: 39 weeks Intrauterine pregnancy: 719w3d     Secondary diagnosis: None     Discharge diagnosis: term pregnancy delivered                                                                                           Post partum procedures:none  Augmentation: none  Complications: None  Hospital course:  Onset of Labor With Vaginal Delivery     36 y.o. yo [id number] at 499w3d was admitted in Active Labor on 04/11/2016. Patient had an uncomplicated labor course as follows:  Membrane Rupture Time/Date: 9:31 PM ,04/11/2016   Intrapartum Procedures: Episiotomy: None [1]                                         Lacerations:  None [1]  Patient had a delivery of a Viable female infant. 04/11/2016  Information for the patient's newborn:  Carmelina PaddockGuetterman, Boy Rayel [[id number]][[id number]]  Delivery Method: Vag-Spont    Pateint had an uncomplicated postpartum course.  She is ambulating, tolerating a regular diet, passing flatus, and urinating well. Patient is discharged home in stable condition on 04/13/16.   Physical exam  Vitals:   04/12/16 1951 04/12/16 2329 04/13/16 0949 04/13/16 0953  BP: 102/63 (!) 103/53 (!) 102/59 (!) 102/59  Pulse: 79 77 73 72  Resp: 20 20 18 18   Temp: 98.4 F (36.9 C) 97.8 F (36.6 C) 97.9 F (36.6 C) 97.9 F (36.6 C)  TempSrc: Oral Oral Oral Oral  SpO2: 100% 99% 99% 99%  Weight:      Height:       General: alert, cooperative and no distress Lochia: appropriate Uterine Fundus: firm at U-1 to U-2/ ML/NT Incision: N/A DVT Evaluation: No evidence of DVT seen on physical exam. No cords or calf tenderness. No significant calf/ankle edema.  Labs: Lab Results  Component Value Date   WBC 18.7 (H) 04/12/2016   HGB 11.1 (L) 04/12/2016   HCT 32.1 (L)  04/12/2016   MCV 90.3 04/12/2016   PLT 230 04/12/2016    Discharge instruction: per After Visit Summary.  Medications:  Allergies as of 04/13/2016      Reactions   Amoxicillin Swelling, Rash, Itching   Swelling on face; itchy all over; rash all over as well   Cortisone Palpitations, Other (See Comments)   Patient passed out after cortisone shot.       Medication List    TAKE these medications   clonazePAM 0.5 MG disintegrating tablet Commonly known as:  KLONOPIN Take 1 tablet (0.5 mg total) by mouth 2 (two) times daily as needed (anxiety).   ibuprofen 600 MG tablet Commonly known as:  ADVIL,MOTRIN Take 1 tablet (600 mg total) by mouth every 6 (six) hours as needed for fever, headache, mild pain, moderate pain or cramping.   multivitamin-prenatal 27-0.8 MG Tabs  tablet Take 1 tablet by mouth daily at 12 noon.   sertraline 100 MG tablet Commonly known as:  ZOLOFT Take 100 mg by mouth daily.       Diet: routine diet  Activity: Advance as tolerated. Pelvic rest for 6 weeks.   Outpatient follow up: Follow-up Information    Thomasene Mohair, MD Follow up in 2 week(s).   Specialty:  Obstetrics and Gynecology Why:  pp depression check Contact information: [address] [place] [address] [phone]             Postpartum contraception: Condoms and Abstinence Rhogam Given postpartum: no Rubella vaccine given postpartum: no Varicella vaccine given postpartum: no TDaP given antepartum or postpartum: declined antepartum/ ordered postpartum  Newborn Data: Live born female/ Jackie  Birth Weight:  7#6.5oz APGAR: 8, 9   Baby Feeding: Breast  Disposition:home with mother  SIGNED: Farrel Conners, CNM

## 2016-04-11 NOTE — Anesthesia Procedure Notes (Signed)
Epidural Patient location during procedure: OB Start time: 04/11/2016 8:15 PM End time: 04/11/2016 8:24 PM  Staffing Anesthesiologist: Lenard SimmerKARENZ, Shyloh Derosa Performed: anesthesiologist   Preanesthetic Checklist Completed: patient identified, site marked, surgical consent, pre-op evaluation, timeout performed, IV checked, risks and benefits discussed and monitors and equipment checked  Epidural Patient position: sitting Prep: ChloraPrep Patient monitoring: heart rate, continuous pulse ox and blood pressure Approach: midline Location: L3-L4 Injection technique: LOR saline  Needle:  Needle type: Tuohy  Needle gauge: 17 G Needle length: 9 cm and 9 Needle insertion depth: 5 cm Catheter type: closed end flexible Catheter size: 19 Gauge Catheter at skin depth: 9 cm Test dose: negative and 1.5% lidocaine with Epi 1:200 K  Assessment Sensory level: T10 Events: blood not aspirated, injection not painful, no injection resistance, negative IV test and no paresthesia  Additional Notes Pt. Evaluated and documentation done after procedure finished. Patient identified. Risks/Benefits/Options discussed with patient including but not limited to bleeding, infection, nerve damage, paralysis, failed block, incomplete pain control, headache, blood pressure changes, nausea, vomiting, reactions to medication both or allergic, itching and postpartum back pain. Confirmed with bedside nurse the patient's most recent platelet count. Confirmed with patient that they are not currently taking any anticoagulation, have any bleeding history or any family history of bleeding disorders. Patient expressed understanding and wished to proceed. All questions were answered. Sterile technique was used throughout the entire procedure. Please see nursing notes for vital signs. Test dose was given through epidural catheter and negative prior to continuing to dose epidural or start infusion. Warning signs of high block given to the  patient including shortness of breath, tingling/numbness in hands, complete motor block, or any concerning symptoms with instructions to call for help. Patient was given instructions on fall risk and not to get out of bed. All questions and concerns addressed with instructions to call with any issues or inadequate analgesia.   Patient tolerated the insertion well without immediate complications.Reason for block:procedure for pain

## 2016-04-11 NOTE — H&P (Addendum)
OB History & Physical   History of Present Illness:  Chief Complaint: contractions  HPI:  Beverly Daniels is a 36 y.o. G3P0 female at 2121w3d dated by LMP consistent with a 9 week ultrasound.  Her pregnancy has been complicated by advanced maternal age (NIPT diploid XY), history of ectopic pregnancy.    She reports contractions.   She denies leakage of fluid.   She denies vaginal bleeding.   She reports fetal movement.    Maternal Medical History:   Past Medical History:  Diagnosis Date  . Anxiety   . Depression   . Ectopic pregnancy     Past Surgical History:  Procedure Laterality Date  . LAPAROSCOPIC OVARIAN CYSTECTOMY Left 07/09/2011  . salpingectomy Left     Allergies  Allergen Reactions  . Amoxicillin Swelling, Rash and Itching    Swelling on face; itchy all over; rash all over as well  . Cortisone Palpitations and Other (See Comments)    Patient passed out after cortisone shot.     Prior to Admission medications   Medication Sig Start Date End Date Taking? Authorizing Provider  Prenatal Vit-Fe Fumarate-FA (MULTIVITAMIN-PRENATAL) 27-0.8 MG TABS tablet Take 1 tablet by mouth daily at 12 noon.   Yes Historical Provider, MD  sertraline (ZOLOFT) 100 MG tablet Take 100 mg by mouth daily.   Yes Historical Provider, MD    OB History  Gravida Para Term Preterm AB Living  3         2  SAB TAB Ectopic Multiple Live Births               # Outcome Date GA Lbr Len/2nd Weight Sex Delivery Anes PTL Lv  3 Current           2 Gravida           1 Gravida               Prenatal care site: [facility]  Social History: She  reports that she has never smoked. She has never used smokeless tobacco. She reports that she does not drink alcohol or use drugs.  Family History: Breast neoplasm, malignant, DM Type II, Skin Carcinoma in situ. She denies a family of gynecologic cancers  Review of Systems: Negative x 10 systems reviewed except as noted in the HPI.    Physical  Exam:  Vital Signs: normotensive. afebrile Constitutional: Well nourished, well developed female in no acute distress.  HEENT: normal Skin: Warm and dry.  Cardiovascular: Regular rate and rhythm.   Extremity: no edema  Respiratory: Clear to auscultation bilateral. Normal respiratory effort Abdomen: FHT present and soft, gravid, nontender Back: no CVAT Neuro: DTRs 2+, Cranial nerves grossly intact Psych: Alert and Oriented x3. No memory deficits. Normal mood and affect.  MS: normal gait, normal bilateral lower extremity ROM/strength/stability.  Pelvic exam: 5cm per RN   Pertinent Results:  Prenatal Labs: Blood type/Rh O positive  Antibody screen negative  Rubella Immune  Varicella Immune    RPR NR  HBsAg negative  HIV negative  GC negative  Chlamydia negative  Genetic screening NIPT XY  1 hour GTT 91  3 hour GTT n/a  GBS positive    Baseline FHR: 125  beats/min   Variability: moderate   Accelerations: present   Decelerations: absent Contractions: present frequency: 3 q 10 min Overall assessment: cat 1  Bedside U/S: fetus is cephalic  Assessment:  Beverly Daniels is a 36 y.o. G3P0 female at 5921w3d with active  labor.   Plan:  1. Admit to Labor & Delivery  2. CBC, T&S, Clrs, IVF 3. GBS positive: strong PCN allergy, pending GBS sensitivity to clinda. Will treat with Vancomycin   4. Fetwal well-being: reassuring 5. Expectant management   Thomasene Mohair, MD 04/11/2016 5:25 PM

## 2016-04-12 LAB — RPR: RPR: NONREACTIVE

## 2016-04-12 LAB — CBC
HEMATOCRIT: 32.1 % — AB (ref 35.0–47.0)
HEMOGLOBIN: 11.1 g/dL — AB (ref 12.0–16.0)
MCH: 31.3 pg (ref 26.0–34.0)
MCHC: 34.7 g/dL (ref 32.0–36.0)
MCV: 90.3 fL (ref 80.0–100.0)
Platelets: 230 10*3/uL (ref 150–440)
RBC: 3.55 MIL/uL — ABNORMAL LOW (ref 3.80–5.20)
RDW: 14.1 % (ref 11.5–14.5)
WBC: 18.7 10*3/uL — ABNORMAL HIGH (ref 3.6–11.0)

## 2016-04-12 MED ORDER — ONDANSETRON HCL 4 MG PO TABS: 4.0000 mg | ORAL_TABLET | ORAL | Status: DC | PRN

## 2016-04-12 MED ORDER — NALBUPHINE HCL 10 MG/ML IJ SOLN: 5.0000 mg | INTRAMUSCULAR | Status: DC | PRN

## 2016-04-12 MED ORDER — WITCH HAZEL-GLYCERIN EX PADS: 1.0000 "application " | MEDICATED_PAD | CUTANEOUS | Status: DC | PRN

## 2016-04-12 MED ORDER — DIBUCAINE 1 % RE OINT: 1.0000 "application " | TOPICAL_OINTMENT | RECTAL | Status: DC | PRN

## 2016-04-12 MED ORDER — IBUPROFEN 600 MG PO TABS
ORAL_TABLET | ORAL | Status: AC
  Administered 2016-04-12: 600 mg
  Filled 2016-04-12: qty 1

## 2016-04-12 MED ORDER — ACETAMINOPHEN 325 MG PO TABS
650.0000 mg | ORAL_TABLET | ORAL | Status: DC | PRN
  Administered 2016-04-12 (×3): 650 mg via ORAL
  Filled 2016-04-12 (×3): qty 2

## 2016-04-12 MED ORDER — KETOROLAC TROMETHAMINE 30 MG/ML IJ SOLN
30.0000 mg | Freq: Four times a day (QID) | INTRAMUSCULAR | Status: DC | PRN
Start: 2016-04-12 — End: 2016-04-12

## 2016-04-12 MED ORDER — FERROUS SULFATE 325 (65 FE) MG PO TABS
325.0000 mg | ORAL_TABLET | Freq: Two times a day (BID) | ORAL | Status: DC
  Administered 2016-04-12 – 2016-04-13 (×4): 325 mg via ORAL
  Filled 2016-04-12 (×4): qty 1

## 2016-04-12 MED ORDER — ONDANSETRON HCL 4 MG/2ML IJ SOLN: 4.0000 mg | INTRAMUSCULAR | Status: DC | PRN

## 2016-04-12 MED ORDER — NALBUPHINE HCL 10 MG/ML IJ SOLN: 5.0000 mg | Freq: Once | INTRAMUSCULAR | Status: DC | PRN

## 2016-04-12 MED ORDER — DIPHENHYDRAMINE HCL 50 MG/ML IJ SOLN: 12.5000 mg | INTRAMUSCULAR | Status: DC | PRN

## 2016-04-12 MED ORDER — PRENATAL MULTIVITAMIN CH
1.0000 | ORAL_TABLET | Freq: Every day | ORAL | Status: DC
  Filled 2016-04-12 (×2): qty 1

## 2016-04-12 MED ORDER — KETOROLAC TROMETHAMINE 30 MG/ML IJ SOLN: 30.0000 mg | Freq: Four times a day (QID) | INTRAMUSCULAR | Status: DC | PRN

## 2016-04-12 MED ORDER — NALOXONE HCL 2 MG/2ML IJ SOSY
1.0000 ug/kg/h | PREFILLED_SYRINGE | INTRAVENOUS | Status: DC | PRN
  Filled 2016-04-12: qty 2

## 2016-04-12 MED ORDER — ONDANSETRON HCL 4 MG/2ML IJ SOLN: 4.0000 mg | Freq: Three times a day (TID) | INTRAMUSCULAR | Status: DC | PRN

## 2016-04-12 MED ORDER — FENTANYL 2.5 MCG/ML W/ROPIVACAINE 0.2% IN NS 100 ML EPIDURAL INFUSION (ARMC-ANES): 10.0000 mL/h | EPIDURAL | Status: DC

## 2016-04-12 MED ORDER — DIPHENHYDRAMINE HCL 25 MG PO CAPS: 25.0000 mg | ORAL_CAPSULE | Freq: Four times a day (QID) | ORAL | Status: DC | PRN

## 2016-04-12 MED ORDER — MEPERIDINE HCL 25 MG/ML IJ SOLN: 6.2500 mg | INTRAMUSCULAR | Status: DC | PRN

## 2016-04-12 MED ORDER — SIMETHICONE 80 MG PO CHEW: 80.0000 mg | CHEWABLE_TABLET | ORAL | Status: DC | PRN

## 2016-04-12 MED ORDER — IBUPROFEN 600 MG PO TABS
600.0000 mg | ORAL_TABLET | Freq: Four times a day (QID) | ORAL | Status: DC
  Administered 2016-04-12 – 2016-04-13 (×6): 600 mg via ORAL
  Filled 2016-04-12 (×6): qty 1

## 2016-04-12 MED ORDER — SENNOSIDES-DOCUSATE SODIUM 8.6-50 MG PO TABS
2.0000 | ORAL_TABLET | ORAL | Status: DC
  Administered 2016-04-12: 2 via ORAL
  Filled 2016-04-12: qty 2

## 2016-04-12 MED ORDER — DIPHENHYDRAMINE HCL 25 MG PO CAPS: 25.0000 mg | ORAL_CAPSULE | ORAL | Status: DC | PRN

## 2016-04-12 MED ORDER — CLONAZEPAM 0.5 MG PO TBDP
0.5000 mg | ORAL_TABLET | Freq: Two times a day (BID) | ORAL | Status: DC | PRN
  Administered 2016-04-12 – 2016-04-13 (×2): 0.5 mg via ORAL
  Filled 2016-04-12 (×3): qty 1

## 2016-04-12 MED ORDER — BENZOCAINE-MENTHOL 20-0.5 % EX AERO: 1.0000 "application " | INHALATION_SPRAY | CUTANEOUS | Status: DC | PRN

## 2016-04-12 MED ORDER — NALOXONE HCL 0.4 MG/ML IJ SOLN: 0.4000 mg | INTRAMUSCULAR | Status: DC | PRN

## 2016-04-12 MED ORDER — COCONUT OIL OIL
1.0000 "application " | TOPICAL_OIL | Status: DC | PRN
  Filled 2016-04-12: qty 120

## 2016-04-12 MED ORDER — HYDROCODONE-ACETAMINOPHEN 5-325 MG PO TABS: 1.0000 | ORAL_TABLET | Freq: Four times a day (QID) | ORAL | Status: DC | PRN

## 2016-04-12 MED ORDER — SODIUM CHLORIDE 0.9% FLUSH: 3.0000 mL | INTRAVENOUS | Status: DC | PRN

## 2016-04-12 NOTE — Progress Notes (Signed)
Patient ID: Beverly Daniels, female   DOB: [birth year], 36 y.o.   MRN: [id number]  Obstetric Postpartum Daily Progress Note Subjective:  [name] postpartum day #1 status post vaginal delivery.  She is ambulating, is tolerating po, is voiding spontaneously.  Her pain is well controlled on PO pain medications. Her lochia is greater than menses.   Medications SCHEDULED MEDICATIONS  . ferrous sulfate  325 mg Oral BID WC  . ibuprofen  600 mg Oral Q6H  . prenatal multivitamin  1 tablet Oral Q1200  . [START ON 04/13/2016] senna-docusate  2 tablet Oral Q24H    MEDICATION INFUSIONS    PRN MEDICATIONS  acetaminophen, benzocaine-Menthol, clonazepam, coconut oil, witch hazel-glycerin **AND** dibucaine, diphenhydrAMINE, HYDROcodone-acetaminophen, ondansetron **OR** ondansetron (ZOFRAN) IV, simethicone    Objective:   Vitals:   04/12/16 0145 04/12/16 0213 04/12/16 0346 04/12/16 0816  BP: (!) 115/58 124/72 (!) 108/53 (!) 103/55  Pulse: 69 78 72 (!) 57  Resp:  18 18 18   Temp:  98.6 F (37 C) 98.7 F (37.1 C) 98.1 F (36.7 C)  TempSrc:  Oral Oral Oral  SpO2:  99% 99% 100%  Weight:      Height:        Current Vital Signs 24h Vital Sign Ranges  T 98.1 F (36.7 C) Temp  Avg: 98.3 F (36.8 C)  Min: 97.9 F (36.6 C)  Max: 98.7 F (37.1 C)  BP (!) 103/55 BP  Min: 103/55  Max: 131/92  HR (!) 57 Pulse  Avg: 81.9  Min: 57  Max: 103  RR 18 Resp  Avg: 17.8  Min: 17  Max: 18  SaO2 100 % Not Delivered SpO2  Avg: 99.4 %  Min: 99 %  Max: 100 %       24 Hour I/O Current Shift I/O  Time Ins Outs 03/17 0701 - 03/18 0700 In: 3686.9 [P.O.:120; I.V.:3366.9] Out: 1075 [Urine:775] No intake/output data recorded.  General: NAD Pulmonary: no increased work of breathing Abdomen: non-distended, non-tender, fundus firm at level of umbilicus Extremities: no edema, no erythema, no tenderness  Labs:   Recent Labs Lab 04/11/16 1726 04/12/16 0438  WBC 15.5* 18.7*  HGB 12.8 11.1*  HCT 36.9 32.1*   PLT 231 230     Assessment:   36 y.o. [id number] postpartum day # 1 status post SVD, doing well  Plan:   1) Acute blood loss anemia - hemodynamically stable and asymptomatic - po ferrous sulfate  2) O POS / Rubella Immune (08/08 0000)/ Varicella Immune  3) TDAP status: declined, flu vaccine: declined  4) breast feeding/Contraception = undecided  5) Disposition: home tomorrow  Thomasene MohairStephen Dellamae Rosamilia, MD 04/12/2016 8:40 AM

## 2016-04-12 NOTE — Anesthesia Postprocedure Evaluation (Signed)
Anesthesia Post Note  Patient: Beverly BullaSarah G Manning  Procedure(s) Performed: * No procedures listed *  Patient location during evaluation: Mother Baby Anesthesia Type: Epidural Level of consciousness: awake and alert Pain management: pain level controlled Vital Signs Assessment: post-procedure vital signs reviewed and stable Respiratory status: spontaneous breathing, nonlabored ventilation and respiratory function stable Cardiovascular status: stable Postop Assessment: no headache, no backache and patient able to bend at knees Anesthetic complications: no     Last Vitals:  Vitals:   04/12/16 0346 04/12/16 0816  BP: (!) 108/53 (!) 103/55  Pulse: 72 (!) 57  Resp: 18 18  Temp: 37.1 C 36.7 C    Last Pain:  Vitals:   04/12/16 0816  TempSrc: Oral  PainSc:                  Cleda MccreedyJoseph K Heleena Miceli

## 2016-04-13 ENCOUNTER — Encounter: Payer: Self-pay | Admitting: Certified Nurse Midwife

## 2016-04-13 MED ORDER — CLONAZEPAM 0.5 MG PO TBDP: 0.5000 mg | ORAL_TABLET | Freq: Two times a day (BID) | ORAL | 1 refills | Status: DC | PRN

## 2016-04-13 MED ORDER — IBUPROFEN 600 MG PO TABS: 600.0000 mg | ORAL_TABLET | Freq: Four times a day (QID) | ORAL | 1 refills | Status: DC | PRN

## 2016-04-13 NOTE — Progress Notes (Signed)
D/C instructions provided, pt states understanding, aware of follow up appt. Pt received prescriptions.   

## 2016-04-13 NOTE — Progress Notes (Signed)
D/C home to car via auxiliary in wheelchair.  

## 2016-04-14 ENCOUNTER — Encounter: Payer: Medicaid Other | Admitting: Obstetrics & Gynecology

## 2016-04-29 ENCOUNTER — Encounter: Payer: Medicaid Other | Admitting: Obstetrics and Gynecology

## 2016-05-27 ENCOUNTER — Ambulatory Visit (INDEPENDENT_AMBULATORY_CARE_PROVIDER_SITE_OTHER): Payer: Medicaid Other | Admitting: Obstetrics and Gynecology

## 2016-05-27 ENCOUNTER — Encounter: Payer: Medicaid Other | Admitting: Obstetrics and Gynecology

## 2016-05-27 ENCOUNTER — Encounter: Payer: Self-pay | Admitting: Obstetrics and Gynecology

## 2016-05-27 VITALS — BP 114/70 | Ht 65.0 in | Wt 143.0 lb

## 2016-05-27 DIAGNOSIS — F4321 Adjustment disorder with depressed mood: Secondary | ICD-10-CM | POA: Diagnosis not present

## 2016-05-27 DIAGNOSIS — N9089 Other specified noninflammatory disorders of vulva and perineum: Secondary | ICD-10-CM

## 2016-05-27 NOTE — Progress Notes (Signed)
Postpartum Visit   Chief Complaint  Patient presents with  . 6 week PostPartum    History of Present Illness: Patient is a 36 y.o. [id number] presents for postpartum visit.  Date of delivery: 04/11/16 Type of delivery: Vaginal delivery - Vacuum or forceps assisted  no Episiotomy No.  Laceration: no  Pregnancy or labor problems:  AMA, anxiety/depression Any problems since the delivery:  no  Newborn Details:  SINGLETON :  1. Baby's name: Annice Pih. Birth weight: 7 lb 6.5 oz Maternal Details:  Breast Feeding:  yes Post partum depression/anxiety noted:  Some anxiety/depression with great control now. No intrusive SI/HI thoughts. Edinburgh Post-Partum Depression Score:  0  Date of last PAP: 09/03/15  normal   Review of Systems: Review of Systems  Constitutional: Negative.   HENT: Negative.   Eyes: Negative.   Respiratory: Negative.   Cardiovascular: Negative.   Gastrointestinal: Negative.   Genitourinary: Negative.   Musculoskeletal: Negative.   Skin: Negative.   Neurological: Negative.   Psychiatric/Behavioral: Negative.     Past Medical History:  Diagnosis Date  . Anxiety   . Depression   . Ectopic pregnancy     Past Surgical History:  Procedure Laterality Date  . LAPAROSCOPIC OVARIAN CYSTECTOMY Left 07/09/2011  . salpingectomy Left     Family History: h/o breast cancer, T2DM, skin cancer  Social History   Social History  . Marital status: Married    Spouse name: N/A  . Number of children: N/A  . Years of education: N/A   Occupational History  . Not on file.   Social History Main Topics  . Smoking status: Never Smoker  . Smokeless tobacco: Never Used  . Alcohol use No  . Drug use: No  . Sexual activity: Yes    Birth control/ protection: None   Other Topics Concern  . Not on file   Social History Narrative  . No narrative on file    Allergies  Allergen Reactions  . Amoxicillin Swelling, Rash and Itching    Swelling on face; itchy all over; rash  all over as well  . Cortisone Palpitations and Other (See Comments)    Patient passed out after cortisone shot.     Medications:   Medication Sig Start Date End Date Taking? Authorizing Provider  clonazePAM (KLONOPIN) 0.5 MG disintegrating tablet Take 1 tablet (0.5 mg total) by mouth 2 (two) times daily as needed (anxiety). 04/13/16  Yes Farrel Conners, CNM  sertraline (ZOLOFT) 100 MG tablet Take 100 mg by mouth daily.   Yes Historical Provider, MD  Prenatal Vit-Fe Fumarate-FA (MULTIVITAMIN-PRENATAL) 27-0.8 MG TABS tablet Take 1 tablet by mouth daily at 12 noon.    Historical Provider, MD    Physical Exam BP 114/70   Ht  (1.651 m)   Wt 143 lb (64.9 kg)   BMI 23.80 kg/m   Physical Exam  Constitutional: She is oriented to person, place, and time. She appears well-developed and well-nourished. No distress.  Genitourinary: Uterus normal. Pelvic exam was performed with patient supine. There is no rash, tenderness, lesion or injury on the right labia.  There is lesion (approximately 5-6 mm nodule, mildly ttp, mobile, no erythema, induration, or warmth) on the left labia. There is no rash, tenderness or injury on the left labia. No erythema, tenderness or bleeding in the vagina. No signs of injury around the vagina. No vaginal discharge found. Right adnexum does not display mass, does not display tenderness and does not display fullness. Left adnexum does not  display mass, does not display tenderness and does not display fullness. Cervix does not exhibit motion tenderness or discharge.   Uterus is mobile and anteverted. Uterus is not enlarged, tender or exhibiting a mass.  HENT:  Head: Normocephalic and atraumatic.  Eyes: EOM are normal. No scleral icterus.  Neck: Normal range of motion. Neck supple. No thyromegaly present.  Cardiovascular: Normal rate and regular rhythm.  Exam reveals no gallop and no friction rub.   No murmur heard. Pulmonary/Chest: Effort normal and breath sounds  normal. No respiratory distress. She has no wheezes. She has no rales.  Abdominal: Soft. Bowel sounds are normal. She exhibits no distension and no mass. There is no tenderness. There is no rebound and no guarding.  Musculoskeletal: Normal range of motion. She exhibits no edema or tenderness.  Lymphadenopathy:    She has no cervical adenopathy.       Right: No inguinal adenopathy present.       Left: No inguinal adenopathy present.  Neurological: She is alert and oriented to person, place, and time. No cranial nerve deficit.  Skin: Skin is warm and dry. No rash noted. No erythema.  Psychiatric: She has a normal mood and affect. Her behavior is normal. Judgment normal.     Female Chaperone present during breast and/or pelvic exam.  Assessment: 36 y.o. [id number] presenting for 6 week postpartum visit  Plan: Problem List Items Addressed This Visit    Adjustment disorder with depressed mood - Primary   Postpartum care following vaginal delivery    Other Visit Diagnoses    Vulvar lesion       will remove at later appointment per patient request. Otherwise, appears benign, but will not resolve on it own.      1) Contraception Education given regarding options for contraception, including barrier methods.  2)  Pap - ASCCP guidelines and rational discussed.  Patient opts for routine screening interval (not due)  3) Patient underwent screening for postpartum depression with no current concerns noted as she is doing well.  4) left vulvar nodule, patient would like to have it removed.  Will schedule that for a couple of weeks.   5) Follow up 1 year for routine annual exam, F/u in a couple weeks to have vulvar lesion removed.   Thomasene Mohair, MD 05/27/2016 10:26 AM

## 2016-06-17 ENCOUNTER — Ambulatory Visit: Payer: Medicaid Other | Admitting: Obstetrics and Gynecology

## 2016-06-23 NOTE — Progress Notes (Signed)
canceled

## 2016-10-22 ENCOUNTER — Telehealth: Payer: Self-pay | Admitting: Obstetrics and Gynecology

## 2016-10-22 ENCOUNTER — Other Ambulatory Visit: Payer: Self-pay | Admitting: Obstetrics and Gynecology

## 2016-10-22 DIAGNOSIS — F4321 Adjustment disorder with depressed mood: Secondary | ICD-10-CM

## 2016-10-22 MED ORDER — SERTRALINE HCL 100 MG PO TABS: 100.0000 mg | ORAL_TABLET | Freq: Every day | ORAL | 1 refills | Status: DC

## 2016-10-22 NOTE — Telephone Encounter (Signed)
Pt needs refill on zolft and she states sdj said to just call if she needed it refilled.

## 2016-10-22 NOTE — Telephone Encounter (Signed)
Please advise 

## 2016-10-22 NOTE — Telephone Encounter (Signed)
I sent this in to [facility] on [address]. There was no pharmacy listed for her in [place]. This was the pharmacy listed in [place]. Would you mind verifying for me that this is the correct pharmacy? Thanks

## 2016-10-23 NOTE — Telephone Encounter (Signed)
Please let me know when pt calls back, or verify pharmacy please

## 2017-10-15 ENCOUNTER — Encounter: Payer: Self-pay | Admitting: Obstetrics and Gynecology

## 2017-10-15 DIAGNOSIS — O0993 Supervision of high risk pregnancy, unspecified, third trimester: Secondary | ICD-10-CM | POA: Insufficient documentation

## 2017-10-15 DIAGNOSIS — O09529 Supervision of elderly multigravida, unspecified trimester: Secondary | ICD-10-CM | POA: Insufficient documentation

## 2017-10-18 ENCOUNTER — Encounter: Payer: Self-pay | Admitting: Obstetrics and Gynecology

## 2017-10-18 ENCOUNTER — Ambulatory Visit (INDEPENDENT_AMBULATORY_CARE_PROVIDER_SITE_OTHER): Payer: Self-pay | Admitting: Obstetrics and Gynecology

## 2017-10-18 VITALS — BP 102/60 | Wt 137.5 lb

## 2017-10-18 DIAGNOSIS — O09529 Supervision of elderly multigravida, unspecified trimester: Secondary | ICD-10-CM

## 2017-10-18 DIAGNOSIS — O09891 Supervision of other high risk pregnancies, first trimester: Secondary | ICD-10-CM

## 2017-10-18 DIAGNOSIS — O0993 Supervision of high risk pregnancy, unspecified, third trimester: Secondary | ICD-10-CM

## 2017-10-18 DIAGNOSIS — Z3A08 8 weeks gestation of pregnancy: Secondary | ICD-10-CM

## 2017-10-18 LAB — POCT URINALYSIS DIPSTICK OB
GLUCOSE, UA: NEGATIVE
PROTEIN: NEGATIVE

## 2017-10-18 NOTE — Progress Notes (Signed)
10/18/2017   Chief Complaint: Missed period  Transfer of Care Patient: no  History of Present Illness: Beverly Daniels is a 37 y.o. Z6X0960 [redacted]w[redacted]d based on Patient's last menstrual period was 08/23/2017 (approximate). with an Estimated Date of Delivery: 05/30/18, with the above CC.   Her periods were: regular periods every 30 days She was using no method when she conceived.  She has Positive signs or symptoms of nausea/vomiting of pregnancy. She has Negative signs or symptoms of miscarriage or preterm labor She identifies Negative Zika risk factors for her and her partner On any different medications around the time she conceived/early pregnancy: No  History of varicella: No   ROS: A 12-point review of systems was performed and negative, except as stated in the above HPI.  OBGYN History: As per HPI. OB History  Gravida Para Term Preterm AB Living  5 2     1 2   SAB TAB Ectopic Multiple Live Births               # Outcome Date GA Lbr Len/2nd Weight Sex Delivery Anes PTL Lv  5 Current           4 Para           3 Para           2 AB           1 Gravida             Any issues with any prior pregnancies: no Any prior children are healthy, doing well, without any problems or issues: no History of pap smears: Yes. Last pap smear 2017. Abnormal: no - negative in [place] History of STIs: No   Past Medical History: Past Medical History:  Diagnosis Date  . Anxiety   . Depression   . Ectopic pregnancy     Past Surgical History: Past Surgical History:  Procedure Laterality Date  . LAPAROSCOPIC OVARIAN CYSTECTOMY Left 07/09/2011  . salpingectomy Left     Family History:  History reviewed. No pertinent family history. She denies any female cancers, bleeding or blood clotting disorders.  She denies any history of mental retardation, birth defects or genetic disorders in her or the FOB's history  Social History:  Social History   Socioeconomic History  . Marital status:  Married    Spouse name: Not on file  . Number of children: Not on file  . Years of education: Not on file  . Highest education level: Not on file  Occupational History  . Not on file  Social Needs  . Financial resource strain: Not on file  . Food insecurity:    Worry: Not on file    Inability: Not on file  . Transportation needs:    Medical: Not on file    Non-medical: Not on file  Tobacco Use  . Smoking status: Never Smoker  . Smokeless tobacco: Never Used  Substance and Sexual Activity  . Alcohol use: No  . Drug use: No  . Sexual activity: Yes    Birth control/protection: None  Lifestyle  . Physical activity:    Days per week: Not on file    Minutes per session: Not on file  . Stress: Not on file  Relationships  . Social connections:    Talks on phone: Not on file    Gets together: Not on file    Attends religious service: Not on file    Active member of club or organization: Not on file  Attends meetings of clubs or organizations: Not on file    Relationship status: Not on file  . Intimate partner violence:    Fear of current or ex partner: Not on file    Emotionally abused: Not on file    Physically abused: Not on file    Forced sexual activity: Not on file  Other Topics Concern  . Not on file  Social History Narrative  . Not on file   Any pets in the household: yes Cats, but she does not change litter  Allergy: Allergies  Allergen Reactions  . Amoxicillin Swelling, Rash and Itching    Swelling on face; itchy all over; rash all over as well  . Cortisone Palpitations and Other (See Comments)    Patient passed out after cortisone shot.     Current Outpatient Medications:  Current Outpatient Medications:  .  Prenatal Vit-Fe Fumarate-FA (MULTIVITAMIN-PRENATAL) 27-0.8 MG TABS tablet, Take 1 tablet by mouth daily at 12 noon., Disp: , Rfl:  .  sertraline (ZOLOFT) 100 MG tablet, Take 1 tablet (100 mg total) by mouth daily., Disp: 90 tablet, Rfl:  1   Physical Exam:   BP 102/60   Wt 137 lb 8 oz (62.4 kg)   LMP 08/23/2017 (Approximate)   BMI 22.88 kg/m  Body mass index is 22.88 kg/m. Constitutional: Well nourished, well developed female in no acute distress.  Neck:  Supple, normal appearance, and no thyromegaly  Cardiovascular: S1, S2 normal, no murmur, rub or gallop, regular rate and rhythm Respiratory:  Clear to auscultation bilateral. Normal respiratory effort Abdomen: positive bowel sounds and no masses, hernias; diffusely non tender to palpation, non distended Breasts: breasts appear normal, no suspicious masses, no skin or nipple changes or axillary nodes, not examined. Neuro/Psych:  Normal mood and affect.  Skin:  Warm and dry.  Lymphatic:  No inguinal lymphadenopathy.   Pelvic exam: Declined because of patient needed to leave with fussy toddler.  Assessment: Beverly Daniels is a 37 y.o. [id number] based on Patient's last menstrual period was 08/23/2017 (approximate). with an Estimated Date of Delivery: 05/30/18,  for prenatal care.  Plan:  1) Avoid alcoholic beverages. 2) Patient encouraged not to smoke.  3) Discontinue the use of all non-medicinal drugs and chemicals.  4) Take prenatal vitamins daily.  5) Seatbelt use advised. 6) Nutrition, food safety (fish, cheese advisories, and high nitrite foods) and exercise discussed. 7) Hospital and practice [facility] discussed  8) Patient is asked about travel to areas at risk for the Zika virus, and counseled to avoid travel and exposure to mosquitoes or sexual partners who may have themselves been exposed to the virus. Testing is discussed, and will be ordered as appropriate.  9) [facility] advised 10) Genetic Screening, such as with 1st Trimester Screening, cell free fetal DNA, AFP testing, and Ultrasound, as well as with amniocentesis and CVS as appropriate, is discussed with patient. She plans to have genetic testing this pregnancy. 11)  Declines vaccinations  She could not stay today for a physical or pelvic exam because her toddler was very fussy in the room. Will return in 1 week for exam and labs.    Problem list reviewed and updated.  Adelene Idlerhristanna Schuman MD [facility],  [facility] 10/18/17 6:32 PM

## 2017-10-18 NOTE — Progress Notes (Signed)
NOB C/o N/V, some cramping, No appetite.  Declines flu shot

## 2017-10-19 LAB — URINE DRUG PANEL 7
Amphetamines, Urine: NEGATIVE ng/mL
BENZODIAZEPINE QUANT UR: NEGATIVE ng/mL
Barbiturate Quant, Ur: NEGATIVE ng/mL
CANNABINOID QUANT UR: NEGATIVE ng/mL
COCAINE (METAB.): NEGATIVE ng/mL
Opiate Quant, Ur: NEGATIVE ng/mL
PCP QUANT UR: NEGATIVE ng/mL

## 2017-10-20 LAB — URINE CULTURE

## 2017-10-21 NOTE — Progress Notes (Signed)
Negative, Released to mychart 

## 2017-10-25 ENCOUNTER — Ambulatory Visit (INDEPENDENT_AMBULATORY_CARE_PROVIDER_SITE_OTHER): Payer: Self-pay | Admitting: Obstetrics & Gynecology

## 2017-10-25 ENCOUNTER — Ambulatory Visit (INDEPENDENT_AMBULATORY_CARE_PROVIDER_SITE_OTHER): Payer: Self-pay

## 2017-10-25 VITALS — BP 100/60 | Wt 136.0 lb

## 2017-10-25 DIAGNOSIS — Z3A09 9 weeks gestation of pregnancy: Secondary | ICD-10-CM

## 2017-10-25 DIAGNOSIS — O09891 Supervision of other high risk pregnancies, first trimester: Secondary | ICD-10-CM

## 2017-10-25 DIAGNOSIS — O09529 Supervision of elderly multigravida, unspecified trimester: Secondary | ICD-10-CM

## 2017-10-25 DIAGNOSIS — O09521 Supervision of elderly multigravida, first trimester: Secondary | ICD-10-CM

## 2017-10-25 DIAGNOSIS — N83201 Unspecified ovarian cyst, right side: Secondary | ICD-10-CM

## 2017-10-25 DIAGNOSIS — O0993 Supervision of high risk pregnancy, unspecified, third trimester: Secondary | ICD-10-CM

## 2017-10-25 DIAGNOSIS — Z124 Encounter for screening for malignant neoplasm of cervix: Secondary | ICD-10-CM

## 2017-10-25 DIAGNOSIS — O3481 Maternal care for other abnormalities of pelvic organs, first trimester: Secondary | ICD-10-CM

## 2017-10-25 LAB — POCT URINALYSIS DIPSTICK OB
GLUCOSE, UA: NEGATIVE
POC,PROTEIN,UA: NEGATIVE

## 2017-10-25 NOTE — Patient Instructions (Signed)

## 2017-10-25 NOTE — Progress Notes (Signed)
  Subjective  Pt has mild nausea and no pain or bleeding [place] today, discussed Umbilical hernia (chronic), had one episode of firm mass there for a few minutes then resolved  Objective  BP 100/60   Wt 136 lb (61.7 kg)   LMP 08/23/2017 (Approximate)   BMI 22.63 kg/m  General: NAD Pumonary: no increased work of breathing Abdomen: gravid, non-tender Extremities: no edema Psychiatric: mood appropriate, affect full  Assessment  37 y.o. Z6X0960 at [redacted]w[redacted]d by  05/30/2018, by Last Menstrual Period presenting for routine prenatal visit  Plan   Problem List Items Addressed This Visit    Antepartum multigravida of advanced maternal age   Supervision of other high risk pregnancies, first trimester [redacted] weeks pregnant, confirmed by [place]  PAP and labs next visit (no ins yet), also to have cfDNA at same visit Genetic testing discussed. Monitor hernia for sx's, may need consult for repair  Review of ULTRASOUND.    I have personally reviewed images and report of recent ultrasound done at [facility].    Plan of management to be discussed with patient.  Annamarie Major, MD, [facility], [facility] 10/25/2017  11:16 AM

## 2017-11-08 ENCOUNTER — Ambulatory Visit (INDEPENDENT_AMBULATORY_CARE_PROVIDER_SITE_OTHER): Payer: Medicaid Other | Admitting: Obstetrics & Gynecology

## 2017-11-08 ENCOUNTER — Other Ambulatory Visit (HOSPITAL_COMMUNITY)
Admission: RE | Admit: 2017-11-08 | Discharge: 2017-11-08 | Disposition: A | Discharge status: Home / Self Care | Payer: Medicaid Other | Source: Ambulatory Visit | Attending: Obstetrics & Gynecology | Admitting: Obstetrics & Gynecology

## 2017-11-08 VITALS — BP 100/60 | Wt 138.0 lb

## 2017-11-08 DIAGNOSIS — Z3481 Encounter for supervision of other normal pregnancy, first trimester: Secondary | ICD-10-CM | POA: Insufficient documentation

## 2017-11-08 DIAGNOSIS — Z124 Encounter for screening for malignant neoplasm of cervix: Secondary | ICD-10-CM | POA: Insufficient documentation

## 2017-11-08 DIAGNOSIS — O09529 Supervision of elderly multigravida, unspecified trimester: Secondary | ICD-10-CM

## 2017-11-08 DIAGNOSIS — Z3A11 11 weeks gestation of pregnancy: Secondary | ICD-10-CM | POA: Diagnosis present

## 2017-11-08 DIAGNOSIS — O09891 Supervision of other high risk pregnancies, first trimester: Secondary | ICD-10-CM

## 2017-11-08 DIAGNOSIS — O09521 Supervision of elderly multigravida, first trimester: Secondary | ICD-10-CM

## 2017-11-08 NOTE — Patient Instructions (Signed)

## 2017-11-08 NOTE — Progress Notes (Signed)
  Subjective  Min nausea.  No pain or bleeding  Objective  BP 100/60   Wt 138 lb (62.6 kg)   LMP 08/23/2017 (Approximate)   BMI 22.96 kg/m  General: NAD Pumonary: no increased work of breathing Abdomen: gravid, non-tender Extremities: no edema Psychiatric: mood appropriate, affect full  Assessment  37 y.o. Z6X0960 at [redacted]w[redacted]d by  05/30/2018, by Last Menstrual Period presenting for routine prenatal visit  Plan   Problem List Items Addressed This Visit      Other   Antepartum multigravida of advanced maternal age   Relevant Orders   MaterniT21 PLUS Core+SCA   Supervision of other high risk pregnancies, first trimester    Other Visit Diagnoses    [redacted] weeks gestation of pregnancy    -  Primary   Relevant Orders   RPR+Rh+ABO+Rub Ab+Ab Scr+CB...   Cytology - PAP   Screening for cervical cancer       Relevant Orders   Cytology - PAP    Labs, PAP, and cfDNA today   Annamarie Major, MD, [facility], [facility] 11/08/2017  9:59 AM

## 2017-11-09 LAB — RPR+RH+ABO+RUB AB+AB SCR+CB...
Antibody Screen: NEGATIVE
HIV Screen 4th Generation wRfx: NONREACTIVE
Hematocrit: 37.1 % (ref 34.0–46.6)
Hemoglobin: 12.3 g/dL (ref 11.1–15.9)
Hepatitis B Surface Ag: NEGATIVE
MCH: 29.1 pg (ref 26.6–33.0)
MCHC: 33.2 g/dL (ref 31.5–35.7)
MCV: 88 fL (ref 79–97)
PLATELETS: 246 10*3/uL (ref 150–450)
RBC: 4.23 x10E6/uL (ref 3.77–5.28)
RDW: 12.6 % (ref 12.3–15.4)
RPR Ser Ql: NONREACTIVE
Rh Factor: POSITIVE
Rubella Antibodies, IGG: 4.44 index (ref >0.99)
VARICELLA: 1454 {index} (ref >165)
WBC: 5.1 10*3/uL (ref 3.4–10.8)

## 2017-11-09 LAB — CYTOLOGY - PAP
Chlamydia: NEGATIVE
DIAGNOSIS: NEGATIVE
NEISSERIA GONORRHEA: NEGATIVE

## 2017-11-15 LAB — MATERNIT21 PLUS CORE+SCA
CHROMOSOME 18: NEGATIVE
CHROMOSOME 21: NEGATIVE
Chromosome 13: NEGATIVE
Y CHROMOSOME: NOT DETECTED

## 2017-12-06 ENCOUNTER — Encounter: Payer: Medicaid Other | Admitting: Obstetrics and Gynecology

## 2017-12-10 ENCOUNTER — Ambulatory Visit (INDEPENDENT_AMBULATORY_CARE_PROVIDER_SITE_OTHER): Payer: Medicaid Other | Admitting: Obstetrics and Gynecology

## 2017-12-10 ENCOUNTER — Encounter: Payer: Self-pay | Admitting: Obstetrics and Gynecology

## 2017-12-10 VITALS — BP 98/54 | Wt 138.0 lb

## 2017-12-10 DIAGNOSIS — Z3A15 15 weeks gestation of pregnancy: Secondary | ICD-10-CM

## 2017-12-10 DIAGNOSIS — O09522 Supervision of elderly multigravida, second trimester: Secondary | ICD-10-CM

## 2017-12-10 DIAGNOSIS — O09529 Supervision of elderly multigravida, unspecified trimester: Secondary | ICD-10-CM

## 2017-12-10 DIAGNOSIS — O09891 Supervision of other high risk pregnancies, first trimester: Secondary | ICD-10-CM

## 2017-12-10 DIAGNOSIS — K420 Umbilical hernia with obstruction, without gangrene: Secondary | ICD-10-CM

## 2017-12-10 LAB — POCT URINALYSIS DIPSTICK OB
Glucose, UA: NEGATIVE
PROTEIN: NEGATIVE

## 2017-12-10 NOTE — Progress Notes (Signed)
ROB

## 2017-12-10 NOTE — Progress Notes (Signed)
Routine Prenatal Care Visit  Subjective  Beverly Daniels is a 37 y.o. [phone] at [redacted]w[redacted]d being seen today for ongoing prenatal care.  She is currently monitored for the following issues for this high-risk pregnancy and has Adjustment disorder with depressed mood; Palpitations; Antepartum multigravida of advanced maternal age; and Supervision of other high risk pregnancies, first trimester on their problem list.  ----------------------------------------------------------------------------------- Patient reports that her nausea has improved with B6. She has an umbilical hernia that is causing her a lot of pain. Sometimes I get nauseaous or have sharp pain. "I feel like i am touching an organ." "Sometimes I get constipated and can't have a bowel movment and it hurts. ".   Nausea is improved with B6 vitamins.  Contractions: Not present. Vag. Bleeding: None.  Movement: Absent. Denies leaking of fluid.  ----------------------------------------------------------------------------------- The following portions of the patient's history were reviewed and updated as appropriate: allergies, current medications, past family history, past medical history, past social history, past surgical history and problem list. Problem list updated.   Objective  Blood pressure (!) 98/54, weight 138 lb (62.6 kg), last menstrual period 08/23/2017, unknown if currently breastfeeding. Pregravid weight 145 lb (65.8 kg) Total Weight Gain -7 lb (-3.175 kg) Urinalysis:      Fetal Status: Fetal Heart Rate (bpm): 148   Movement: Absent     General:  Alert, oriented and cooperative. Patient is in no acute distress.  Skin: Skin is warm and dry. No rash noted.   Cardiovascular: Normal heart rate noted  Respiratory: Normal respiratory effort, no problems with respiration noted  Abdomen: Soft, gravid, appropriate for gestational age. Pain/Pressure: Present     Pelvic:  Cervical exam deferred        Extremities: Normal range of  motion.     Mental Status: Normal mood and affect. Normal behavior. Normal judgment and thought content.     Assessment   37 y.o. G2X5284 at [redacted]w[redacted]d by  05/30/2018, by Last Menstrual Period presenting for routine prenatal visit  Plan   Pregnancy #4 Problems (from 08/23/17 to present)    Problem Noted Resolved   Antepartum multigravida of advanced maternal age 59/20/2019 by Natale Milch, MD No   Supervision of other high risk pregnancies, first trimester 10/15/2017 by Natale Milch, MD No   Overview Addendum 12/10/2017 10:07 AM by Natale Milch, MD      Clinic [facility] Prenatal Labs  Dating  LMP =9wk [place] Blood type: O/Positive/-- (10/14 1009)   Genetic Screen  NIPS:   Normal xx Antibody:Negative (10/14 1009)  Anatomic [place]  Rubella: 4.44 (10/14 1009)   Varicella: Immune  GTT Early:        28 wk:      RPR: Non Reactive (10/14 1009)   Rhogam  not needed HBsAg: Negative (10/14 1009)   TDaP vaccine   Declines                    HIV: Non Reactive (10/14 1009)   Flu Shot    Declines                            GBS:   Contraception  Pap: NIL 2019  CBB     CS/VBAC    Baby Food  Breast   Support Person                 Gestational age appropriate obstetric precautions including but not limited  to vaginal bleeding, contractions, leaking of fluid and fetal movement were reviewed in detail with the patient.    Concern for incarcerated hernia in pregnancy. Will obtain abdominal MRI to evaluate.  Anatomy US at next visit  Return in about 4 weeks (around 01/07/2018) for ROB and US.  Natale Milchhristanna R Shai Mckenzie MD [facility], [facility] 12/10/2017, 10:22 AM

## 2017-12-15 ENCOUNTER — Ambulatory Visit
Admission: RE | Admit: 2017-12-15 | Discharge: 2017-12-15 | Disposition: A | Discharge status: Home / Self Care | Payer: Medicaid Other | Source: Ambulatory Visit | Attending: Obstetrics and Gynecology | Admitting: Obstetrics and Gynecology

## 2017-12-15 DIAGNOSIS — K420 Umbilical hernia with obstruction, without gangrene: Secondary | ICD-10-CM | POA: Diagnosis present

## 2017-12-16 NOTE — Progress Notes (Signed)
MRI negative of incarcerated bowel. Discussed with patient on the phone. Patient stated that she has not had pain since she saw me last week and has been feeling better.

## 2018-01-07 ENCOUNTER — Ambulatory Visit (INDEPENDENT_AMBULATORY_CARE_PROVIDER_SITE_OTHER): Payer: Medicaid Other

## 2018-01-07 ENCOUNTER — Ambulatory Visit (INDEPENDENT_AMBULATORY_CARE_PROVIDER_SITE_OTHER): Payer: Medicaid Other | Admitting: Obstetrics and Gynecology

## 2018-01-07 VITALS — BP 110/60 | Wt 140.0 lb

## 2018-01-07 DIAGNOSIS — Z3A19 19 weeks gestation of pregnancy: Secondary | ICD-10-CM

## 2018-01-07 DIAGNOSIS — Z363 Encounter for antenatal screening for malformations: Secondary | ICD-10-CM | POA: Diagnosis not present

## 2018-01-07 DIAGNOSIS — O09522 Supervision of elderly multigravida, second trimester: Secondary | ICD-10-CM

## 2018-01-07 DIAGNOSIS — O09891 Supervision of other high risk pregnancies, first trimester: Secondary | ICD-10-CM

## 2018-01-07 DIAGNOSIS — O09529 Supervision of elderly multigravida, unspecified trimester: Secondary | ICD-10-CM

## 2018-01-07 NOTE — Progress Notes (Signed)
ROB No concerns 

## 2018-01-07 NOTE — Progress Notes (Signed)
    Routine Prenatal Care Visit  Subjective  [name] is a 37 y.o. [phone] at 7438w4d being seen today for ongoing prenatal care.  She is currently monitored for the following issues for this high-risk pregnancy and has Adjustment disorder with depressed mood; Palpitations; Antepartum multigravida of advanced maternal age; and Supervision of other high risk pregnancies, first trimester on their problem list.  ----------------------------------------------------------------------------------- Patient reports no complaints.   Contractions: Not present. Vag. Bleeding: None.  Movement: Present. Denies leaking of fluid.  ----------------------------------------------------------------------------------- The following portions of the patient's history were reviewed and updated as appropriate: allergies, current medications, past family history, past medical history, past social history, past surgical history and problem list. Problem list updated.   Objective  Blood pressure 110/60, weight 140 lb (63.5 kg), last menstrual period 08/23/2017, unknown if currently breastfeeding. Pregravid weight 145 lb (65.8 kg) Total Weight Gain -5 lb (-2.268 kg) Urinalysis:      Fetal Status: Fetal Heart Rate (bpm): 157   Movement: Present  Presentation: Vertex  General:  Alert, oriented and cooperative. Patient is in no acute distress.  Skin: Skin is warm and dry. No rash noted.   Cardiovascular: Normal heart rate noted  Respiratory: Normal respiratory effort, no problems with respiration noted  Abdomen: Soft, gravid, appropriate for gestational age. Pain/Pressure: Absent     Pelvic:  Cervical exam deferred        Extremities: Normal range of motion.     Mental Status: Normal mood and affect. Normal behavior. Normal judgment and thought content.     Assessment   37 y.o. [id number] at 7438w4d by  05/30/2018, by Last Menstrual Period presenting for routine prenatal visit  Plan   Pregnancy #4 Problems  (from 08/23/17 to present)    Problem Noted Resolved   Antepartum multigravida of advanced maternal age 37/20/2019 by Natale MilchSchuman, Aamir Mclinden R, MD No   Supervision of other high risk pregnancies, first trimester 10/15/2017 by Natale MilchSchuman, Mckinna Demars R, MD No   Overview Addendum 01/07/2018 11:10 AM by Natale MilchSchuman, Annaleese Guier R, MD      Clinic [facility] Prenatal Labs  Dating  LMP =9wk US Blood type: O/Positive/-- (10/14 1009)   Genetic Screen  NIPS:   Normal xx Antibody:Negative (10/14 1009)  Anatomic US complete Rubella: 4.44 (10/14 1009)   Varicella: Immune  GTT Early:        28 wk:      RPR: Non Reactive (10/14 1009)   Rhogam  not needed HBsAg: Negative (10/14 1009)   TDaP vaccine   Declines                    HIV: Non Reactive (10/14 1009)   Flu Shot    Declines                            GBS:   Contraception  Pap: NIL 2019  CBB     CS/VBAC    Baby Food  Breast   Support Person                 Gestational age appropriate obstetric precautions including but not limited to vaginal bleeding, contractions, leaking of fluid and fetal movement were reviewed in detail with the patient.    Return in about 4 weeks (around 02/04/2018) for ROB.  Natale Milchhristanna R Latrenda Irani MD [facility], [facility] 01/07/2018, 11:13 AM

## 2018-01-26 NOTE — L&D Delivery Note (Signed)
Delivery Note Primary OB: [facility] Delivery Physician: Annamarie Major, MD Gestational Age: Full term Antepartum complications: AMA Intrapartum complications: Precipitous delivery  A viable Female was delivered via vertex perentation.  Apgars:8 ,9  Weight:  pending .   Placenta status: spontaneous and Intact.  Cord: 3+ vessels;  with the following complications: nuchal.  Anesthesia:  none Episiotomy:  none Lacerations:  none Suture Repair: none Est. Blood Loss (mL):  less than 100 mL  Mom to postpartum.  Baby to Couplet care / Skin to Skin.  Annamarie Major, MD, [facility], [facility] 05/19/2018  10:55 PM [phone]

## 2018-02-04 ENCOUNTER — Ambulatory Visit (INDEPENDENT_AMBULATORY_CARE_PROVIDER_SITE_OTHER): Payer: Medicaid Other | Admitting: Maternal Newborn

## 2018-02-04 ENCOUNTER — Encounter: Payer: Self-pay | Admitting: Maternal Newborn

## 2018-02-04 VITALS — BP 90/52 | Wt 145.0 lb

## 2018-02-04 DIAGNOSIS — Z3A23 23 weeks gestation of pregnancy: Secondary | ICD-10-CM

## 2018-02-04 DIAGNOSIS — O09891 Supervision of other high risk pregnancies, first trimester: Secondary | ICD-10-CM

## 2018-02-04 DIAGNOSIS — O09522 Supervision of elderly multigravida, second trimester: Secondary | ICD-10-CM

## 2018-02-04 LAB — POCT URINALYSIS DIPSTICK OB
GLUCOSE, UA: NEGATIVE
POC,PROTEIN,UA: NEGATIVE

## 2018-02-04 NOTE — Progress Notes (Signed)
ROB- has been having lower back pain after putting together a toy shelf and since then pain is there x 5 days

## 2018-02-04 NOTE — Progress Notes (Signed)
Routine Prenatal Care Visit  Subjective  Beverly Daniels is a 38 y.o. [phone] at 345w4d being seen today for ongoing prenatal care.  She is currently monitored for the following issues for this high-risk pregnancy and has Adjustment disorder with depressed mood; Palpitations; Antepartum multigravida of advanced maternal age; and Supervision of other high risk pregnancies, first trimester on their problem list.  ----------------------------------------------------------------------------------- Patient reports lower back pain. She feels that she injured her back with some heavier lifting when assembling an item about four days ago. It is still painful and hurts more when she lifts her child. Contractions: Not present. Vag. Bleeding: None.  Movement: Present. No leaking of fluid.  ----------------------------------------------------------------------------------- The following portions of the patient's history were reviewed and updated as appropriate: allergies, current medications, past family history, past medical history, past social history, past surgical history and problem list. Problem list updated.   Objective  Blood pressure (!) 90/52, weight 145 lb (65.8 kg), last menstrual period 08/23/2017. Pregravid weight 145 lb (65.8 kg) Total Weight Gain 0 lb (0 kg) Body mass index is 24.13 kg/m.   Urinalysis: Urine dipstick shows negative for glucose, protein.  Fetal Status: Fetal Heart Rate (bpm): 151 Fundal Height: 23 cm Movement: Present     General:  Alert, oriented and cooperative. Patient is in no acute distress.  Skin: Skin is warm and dry. No rash noted.   Cardiovascular: Normal heart rate noted  Respiratory: Normal respiratory effort, no problems with respiration noted  Abdomen: Soft, gravid, appropriate for gestational age. Pain/Pressure: Absent     Pelvic:  Cervical exam deferred        Extremities: Normal range of motion.     Mental Status: Normal mood and affect. Normal  behavior. Normal judgment and thought content.     Assessment   37 y.o. [id number] at 255w4d, EDD 05/30/2018 by Last Menstrual Period presenting for a routine prenatal visit.  Plan   Pregnancy #4 Problems (from 08/23/17 to present)    Problem Noted Resolved   Antepartum multigravida of advanced maternal age 32/20/2019 by Natale MilchSchuman, Christanna R, MD No   Supervision of other high risk pregnancies, first trimester 10/15/2017 by Natale MilchSchuman, Christanna R, MD No   Overview Addendum 01/07/2018 11:10 AM by Natale MilchSchuman, Christanna R, MD      Clinic [facility] Prenatal Labs  Dating  LMP =9wk US Blood type: O/Positive/-- (10/14 1009)   Genetic Screen  NIPS:   Normal xx Antibody:Negative (10/14 1009)  Anatomic US complete Rubella: 4.44 (10/14 1009)   Varicella: Immune  GTT Early:        28 wk:      RPR: Non Reactive (10/14 1009)   Rhogam  not needed HBsAg: Negative (10/14 1009)   TDaP vaccine   Declines                    HIV: Non Reactive (10/14 1009)   Flu Shot    Declines                            GBS:   Contraception  Pap: NIL 2019  CBB     CS/VBAC    Baby Food  Breast   Support Person              Discussed comfort measures for lower back pain (Tylenol, heating pad, muscle rub, maternity support belt, massage therapy) and using good body mechanics when lifting.  Please refer to  After Visit Summary for other counseling recommendations.   Return in about 4 weeks (around 03/04/2018) for ROB with GTT/28 week labs.  Marcelyn Bruins, CNM 02/04/2018  9:51 AM

## 2018-02-04 NOTE — Patient Instructions (Signed)

## 2018-02-15 ENCOUNTER — Other Ambulatory Visit: Payer: Self-pay

## 2018-02-15 DIAGNOSIS — F4321 Adjustment disorder with depressed mood: Secondary | ICD-10-CM

## 2018-02-17 NOTE — Telephone Encounter (Signed)
I ordered this over 1 year ago and have not seen her since. So, I have to decline.

## 2018-03-03 ENCOUNTER — Telehealth: Payer: Self-pay

## 2018-03-03 NOTE — Telephone Encounter (Signed)
She can check her blood sugar four times a day for a week, fasting and 2 hours after breakfast, lunch, and dinner. She would have to get supplies to do that and be able to demonstrate normal results with a written log. The 1 hour test is generally preferred, but she can do this if she would like. Please let her know, thanks.

## 2018-03-03 NOTE — Telephone Encounter (Signed)
Pt calling to see if there is another way to do the glucose test.  She is accustomed to eating pretty early in the morning then drinking the drink would make her sick.  She is not looking forward to that.  Scheduled for tomorrow. What to do?  Is there another blood test that could be done instead?  [phone]

## 2018-03-03 NOTE — Telephone Encounter (Signed)
Pt aware.  Doesn't want to ck her blood sugar for a week.  She was under the impression she needed to be fasting.  Adv she did not need to be fasting.  She can eat a protein bkfst.  Pt will do that and do the test.

## 2018-03-04 ENCOUNTER — Ambulatory Visit (INDEPENDENT_AMBULATORY_CARE_PROVIDER_SITE_OTHER): Payer: Medicaid Other | Admitting: Maternal Newborn

## 2018-03-04 ENCOUNTER — Encounter: Payer: Self-pay | Admitting: Maternal Newborn

## 2018-03-04 ENCOUNTER — Other Ambulatory Visit: Payer: Medicaid Other

## 2018-03-04 VITALS — BP 108/52 | Wt 154.0 lb

## 2018-03-04 DIAGNOSIS — O09891 Supervision of other high risk pregnancies, first trimester: Secondary | ICD-10-CM

## 2018-03-04 DIAGNOSIS — O09522 Supervision of elderly multigravida, second trimester: Secondary | ICD-10-CM

## 2018-03-04 DIAGNOSIS — Z3A27 27 weeks gestation of pregnancy: Secondary | ICD-10-CM

## 2018-03-04 LAB — POCT URINALYSIS DIPSTICK OB
Glucose, UA: NEGATIVE
POC,PROTEIN,UA: NEGATIVE

## 2018-03-04 NOTE — Progress Notes (Signed)
    Routine Prenatal Care Visit  Subjective  Beverly Daniels is a 38 y.o. [phone] at [redacted]w[redacted]d being seen today for ongoing prenatal care.  She is currently monitored for the following issues for this high-risk pregnancy and has Adjustment disorder with depressed mood; Palpitations; Antepartum multigravida of advanced maternal age; and Supervision of other high risk pregnancies, first trimester on their problem list.  ----------------------------------------------------------------------------------- Patient reports no complaints.   Contractions: Not present. Vag. Bleeding: None.  Movement: Present. No leaking of fluid.  ----------------------------------------------------------------------------------- The following portions of the patient's history were reviewed and updated as appropriate: allergies, current medications, past family history, past medical history, past social history, past surgical history and problem list. Problem list updated.  Objective  Blood pressure (!) 108/52, weight 154 lb (69.9 kg), last menstrual period 08/23/2017. Pregravid weight 145 lb (65.8 kg) Total Weight Gain 9 lb (4.082 kg) Body mass index is 25.63 kg/m.   Urinalysis: Urine dipstick shows negative for glucose, protein.  Fetal Status: Fetal Heart Rate (bpm): 144 Fundal Height: 28 cm Movement: Present     General:  Alert, oriented and cooperative. Patient is in no acute distress.  Skin: Skin is warm and dry. No rash noted.   Cardiovascular: Normal heart rate noted  Respiratory: Normal respiratory effort, no problems with respiration noted  Abdomen: Soft, gravid, appropriate for gestational age. Pain/Pressure: Absent     Pelvic:  Cervical exam deferred        Extremities: Normal range of motion.  Edema: None  Mental Status: Normal mood and affect. Normal behavior. Normal judgment and thought content.    Assessment   37 y.o. [id number] at [redacted]w[redacted]d, EDD 05/30/2018 by Last Menstrual Period presenting for a  routine prenatal visit.  Plan   Pregnancy #4 Problems (from 08/23/17 to present)    Problem Noted Resolved   Antepartum multigravida of advanced maternal age 15/20/2019 by Natale Milch, MD No   Supervision of other high risk pregnancies, first trimester 10/15/2017 by Natale Milch, MD No   Overview Addendum 01/07/2018 11:10 AM by Natale Milch, MD      [facility] Prenatal Labs  Dating  LMP =9wk [place] Blood type: O/Positive/-- (10/14 1009)   Genetic Screen  NIPS:   Normal xx Antibody:Negative (10/14 1009)  Anatomic [place] complete Rubella: 4.44 (10/14 1009)   Varicella: Immune  GTT Early:        28 wk:      RPR: Non Reactive (10/14 1009)   Rhogam  not needed HBsAg: Negative (10/14 1009)   TDaP vaccine   Declines                    HIV: Non Reactive (10/14 1009)   Flu Shot    Declines                            GBS:   Contraception  Pap: NIL 2019  CBB     CS/VBAC    Baby Food  Breast   Support Person              GTT and 28 week labs today.  Please refer to After Visit Summary for other counseling recommendations.   Return in about 2 weeks (around 03/18/2018) for ROB.  Marcelyn Bruins, CNM 03/04/2018

## 2018-03-04 NOTE — Progress Notes (Signed)
ROB 28 week labs 

## 2018-03-04 NOTE — Patient Instructions (Signed)
Third Trimester of Pregnancy The third trimester is from week 28 through week 40 (months 7 through 9). The third trimester is a time when the unborn baby (fetus) is growing rapidly. At the end of the ninth month, the fetus is about 20 inches in length and weighs 6-10 pounds. Body changes during your third trimester Your body will continue to go through many changes during pregnancy. The changes vary from woman to woman. During the third trimester:  Your weight will continue to increase. You can expect to gain 25-35 pounds (11-16 kg) by the end of the pregnancy.  You may begin to get stretch marks on your hips, abdomen, and breasts.  You may urinate more often because the fetus is moving lower into your pelvis and pressing on your bladder.  You may develop or continue to have heartburn. This is caused by increased hormones that slow down muscles in the digestive tract.  You may develop or continue to have constipation because increased hormones slow digestion and cause the muscles that push waste through your intestines to relax.  You may develop hemorrhoids. These are swollen veins (varicose veins) in the rectum that can itch or be painful.  You may develop swollen, bulging veins (varicose veins) in your legs.  You may have increased body aches in the pelvis, back, or thighs. This is due to weight gain and increased hormones that are relaxing your joints.  You may have changes in your hair. These can include thickening of your hair, rapid growth, and changes in texture. Some women also have hair loss during or after pregnancy, or hair that feels dry or thin. Your hair will most likely return to normal after your baby is born.  Your breasts will continue to grow and they will continue to become tender. A yellow fluid (colostrum) may leak from your breasts. This is the first milk you are producing for your baby.  Your belly button may stick out.  You may notice more swelling in your hands,  face, or ankles.  You may have increased tingling or numbness in your hands, arms, and legs. The skin on your belly may also feel numb.  You may feel short of breath because of your expanding uterus.  You may have more problems sleeping. This can be caused by the size of your belly, increased need to urinate, and an increase in your body's metabolism.  You may notice the fetus "dropping," or moving lower in your abdomen (lightening).  You may have increased vaginal discharge.  You may notice your joints feel loose and you may have pain around your pelvic bone. What to expect at prenatal visits You will have prenatal exams every 2 weeks until week 36. Then you will have weekly prenatal exams. During a routine prenatal visit:  You will be weighed to make sure you and the baby are growing normally.  Your blood pressure will be taken.  Your abdomen will be measured to track your baby's growth.  The fetal heartbeat will be listened to.  Any test results from the previous visit will be discussed.  You may have a cervical check near your due date to see if your cervix has softened or thinned (effaced).  You will be tested for Group B streptococcus. This happens between 35 and 37 weeks. Your health care provider may ask you:  What your birth plan is.  How you are feeling.  If you are feeling the baby move.  If you have had any abnormal   symptoms, such as leaking fluid, bleeding, severe headaches, or abdominal cramping.  If you are using any tobacco products, including cigarettes, chewing tobacco, and electronic cigarettes.  If you have any questions. Other tests or screenings that may be performed during your third trimester include:  Blood tests that check for low iron levels (anemia).  Fetal testing to check the health, activity level, and growth of the fetus. Testing is done if you have certain medical conditions or if there are problems during the pregnancy.  Nonstress test  (NST). This test checks the health of your baby to make sure there are no signs of problems, such as the baby not getting enough oxygen. During this test, a belt is placed around your belly. The baby is made to move, and its heart rate is monitored during movement. What is false labor? False labor is a condition in which you feel small, irregular tightenings of the muscles in the womb (contractions) that usually go away with rest, changing position, or drinking water. These are called Braxton Hicks contractions. Contractions may last for hours, days, or even weeks before true labor sets in. If contractions come at regular intervals, become more frequent, increase in intensity, or become painful, you should see your health care provider. What are the signs of labor?  Abdominal cramps.  Regular contractions that start at 10 minutes apart and become stronger and more frequent with time.  Contractions that start on the top of the uterus and spread down to the lower abdomen and back.  Increased pelvic pressure and dull back pain.  A watery or bloody mucus discharge that comes from the vagina.  Leaking of amniotic fluid. This is also known as your "water breaking." It could be a slow trickle or a gush. Let your health care provider know if it has a color or strange odor. If you have any of these signs, call your health care provider right away, even if it is before your due date. Follow these instructions at home: Medicines  Follow your health care provider's instructions regarding medicine use. Specific medicines may be either safe or unsafe to take during pregnancy.  Take a prenatal vitamin that contains at least 600 micrograms (mcg) of folic acid.  If you develop constipation, try taking a stool softener if your health care provider approves. Eating and drinking   Eat a balanced diet that includes fresh fruits and vegetables, whole grains, good sources of protein such as meat, eggs, or tofu,  and low-fat dairy. Your health care provider will help you determine the amount of weight gain that is right for you.  Avoid raw meat and uncooked cheese. These carry germs that can cause birth defects in the baby.  If you have low calcium intake from food, talk to your health care provider about whether you should take a daily calcium supplement.  Eat four or five small meals rather than three large meals a day.  Limit foods that are high in fat and processed sugars, such as fried and sweet foods.  To prevent constipation: ? Drink enough fluid to keep your urine clear or pale yellow. ? Eat foods that are high in fiber, such as fresh fruits and vegetables, whole grains, and beans. Activity  Exercise only as directed by your health care provider. Most women can continue their usual exercise routine during pregnancy. Try to exercise for 30 minutes at least 5 days a week. Stop exercising if you experience uterine contractions.  Avoid heavy lifting.  Do   not exercise in extreme heat or humidity, or at high altitudes.  Wear low-heel, comfortable shoes.  Practice good posture.  You may continue to have sex unless your health care provider tells you otherwise. Relieving pain and discomfort  Take frequent breaks and rest with your legs elevated if you have leg cramps or low back pain.  Take warm sitz baths to soothe any pain or discomfort caused by hemorrhoids. Use hemorrhoid cream if your health care provider approves.  Wear a good support bra to prevent discomfort from breast tenderness.  If you develop varicose veins: ? Wear support pantyhose or compression stockings as told by your healthcare provider. ? Elevate your feet for 15 minutes, 3-4 times a day. Prenatal care  Write down your questions. Take them to your prenatal visits.  Keep all your prenatal visits as told by your health care provider. This is important. Safety  Wear your seat belt at all times when driving.  Make  a list of emergency phone numbers, including numbers for family, friends, the hospital, and police and fire departments. General instructions  Avoid cat litter boxes and soil used by cats. These carry germs that can cause birth defects in the baby. If you have a cat, ask someone to clean the litter box for you.  Do not travel far distances unless it is absolutely necessary and only with the approval of your health care provider.  Do not use hot tubs, steam rooms, or saunas.  Do not drink alcohol.  Do not use any products that contain nicotine or tobacco, such as cigarettes and e-cigarettes. If you need help quitting, ask your health care provider.  Do not use any medicinal herbs or unprescribed drugs. These chemicals affect the formation and growth of the baby.  Do not douche or use tampons or scented sanitary pads.  Do not cross your legs for long periods of time.  To prepare for the arrival of your baby: ? Take prenatal classes to understand, practice, and ask questions about labor and delivery. ? Make a trial run to the hospital. ? Visit the hospital and tour the maternity area. ? Arrange for maternity or paternity leave through employers. ? Arrange for family and friends to take care of pets while you are in the hospital. ? Purchase a rear-facing car seat and make sure you know how to install it in your car. ? Pack your hospital bag. ? Prepare the baby's nursery. Make sure to remove all pillows and stuffed animals from the baby's crib to prevent suffocation.  Visit your dentist if you have not gone during your pregnancy. Use a soft toothbrush to brush your teeth and be gentle when you floss. Contact a health care provider if:  You are unsure if you are in labor or if your water has broken.  You become dizzy.  You have mild pelvic cramps, pelvic pressure, or nagging pain in your abdominal area.  You have lower back pain.  You have persistent nausea, vomiting, or  diarrhea.  You have an unusual or bad smelling vaginal discharge.  You have pain when you urinate. Get help right away if:  Your water breaks before 37 weeks.  You have regular contractions less than 5 minutes apart before 37 weeks.  You have a fever.  You are leaking fluid from your vagina.  You have spotting or bleeding from your vagina.  You have severe abdominal pain or cramping.  You have rapid weight loss or weight gain.  You have   shortness of breath with chest pain.  You notice sudden or extreme swelling of your face, hands, ankles, feet, or legs.  Your baby makes fewer than 10 movements in 2 hours.  You have severe headaches that do not go away when you take medicine.  You have vision changes. Summary  The third trimester is from week 28 through week 40, months 7 through 9. The third trimester is a time when the unborn baby (fetus) is growing rapidly.  During the third trimester, your discomfort may increase as you and your baby continue to gain weight. You may have abdominal, leg, and back pain, sleeping problems, and an increased need to urinate.  During the third trimester your breasts will keep growing and they will continue to become tender. A yellow fluid (colostrum) may leak from your breasts. This is the first milk you are producing for your baby.  False labor is a condition in which you feel small, irregular tightenings of the muscles in the womb (contractions) that eventually go away. These are called Braxton Hicks contractions. Contractions may last for hours, days, or even weeks before true labor sets in.  Signs of labor can include: abdominal cramps; regular contractions that start at 10 minutes apart and become stronger and more frequent with time; watery or bloody mucus discharge that comes from the vagina; increased pelvic pressure and dull back pain; and leaking of amniotic fluid. This information is not intended to replace advice given to you by your  health care provider. Make sure you discuss any questions you have with your health care provider. Document Released: 01/06/2001 Document Revised: 02/18/2016 Document Reviewed: 02/18/2016 Elsevier Interactive Patient Education  2019 [employer]  

## 2018-03-05 LAB — 28 WEEK RH+PANEL
BASOS ABS: 0 10*3/uL (ref 0.0–0.2)
Basos: 0 %
EOS (ABSOLUTE): 0.8 10*3/uL — AB (ref 0.0–0.4)
Eos: 8 %
Gestational Diabetes Screen: 62 mg/dL — ABNORMAL LOW (ref 65–139)
HIV Screen 4th Generation wRfx: NONREACTIVE
Hematocrit: 33.8 % — ABNORMAL LOW (ref 34.0–46.6)
Hemoglobin: 11.5 g/dL (ref 11.1–15.9)
Immature Grans (Abs): 0.1 10*3/uL (ref 0.0–0.1)
Immature Granulocytes: 1 %
Lymphocytes Absolute: 1.6 10*3/uL (ref 0.7–3.1)
Lymphs: 18 %
MCH: 31.4 pg (ref 26.6–33.0)
MCHC: 34 g/dL (ref 31.5–35.7)
MCV: 92 fL (ref 79–97)
Monocytes Absolute: 0.5 10*3/uL (ref 0.1–0.9)
Monocytes: 5 %
Neutrophils Absolute: 6.2 10*3/uL (ref 1.4–7.0)
Neutrophils: 68 %
Platelets: 230 10*3/uL (ref 150–450)
RBC: 3.66 x10E6/uL — ABNORMAL LOW (ref 3.77–5.28)
RDW: 13 % (ref 11.7–15.4)
RPR Ser Ql: NONREACTIVE
WBC: 9.2 10*3/uL (ref 3.4–10.8)

## 2018-03-18 ENCOUNTER — Encounter: Payer: Medicaid Other | Admitting: Maternal Newborn

## 2018-03-21 ENCOUNTER — Ambulatory Visit (INDEPENDENT_AMBULATORY_CARE_PROVIDER_SITE_OTHER): Payer: Medicaid Other | Admitting: Advanced Practice Midwife

## 2018-03-21 ENCOUNTER — Encounter: Payer: Self-pay | Admitting: Advanced Practice Midwife

## 2018-03-21 VITALS — BP 110/58 | Wt 156.0 lb

## 2018-03-21 DIAGNOSIS — Z3A30 30 weeks gestation of pregnancy: Secondary | ICD-10-CM

## 2018-03-21 NOTE — Progress Notes (Signed)
ROB C/o sore throat, hip pain that keeps her up at night because of pain  Declined Tdap, BT consent signed

## 2018-03-21 NOTE — Progress Notes (Signed)
Routine Prenatal Care Visit  Subjective  Beverly Daniels is a 38 y.o. [phone] at [redacted]w[redacted]d being seen today for ongoing prenatal care.  She is currently monitored for the following issues for this low-risk pregnancy and has Adjustment disorder with depressed mood; Palpitations; Antepartum multigravida of advanced maternal age; and Supervision of other high risk pregnancies, first trimester on their problem list.  ----------------------------------------------------------------------------------- Patient reports hip pain/pressure while sleeping, sore throat without URI s/s or cough.   Concerns about having used topical Retinol products in 1st trimester. Contractions: Not present. Vag. Bleeding: None.  Movement: Present. Denies leaking of fluid.  ----------------------------------------------------------------------------------- The following portions of the patient's history were reviewed and updated as appropriate: allergies, current medications, past family history, past medical history, past social history, past surgical history and problem list. Problem list updated.   Objective  Blood pressure (!) 110/58, weight 156 lb (70.8 kg), last menstrual period 08/23/2017 Pregravid weight 145 lb (65.8 kg) Total Weight Gain 11 lb (4.99 kg) Urinalysis: Urine Protein    Urine Glucose    Fetal Status: Fetal Heart Rate (bpm): 146 Fundal Height: 29 cm Movement: Present     General:  Alert, oriented and cooperative. Patient is in no acute distress.  Skin: Skin is warm and dry. No rash noted.   Cardiovascular: Normal heart rate noted  Respiratory: Normal respiratory effort, no problems with respiration noted  Abdomen: Soft, gravid, appropriate for gestational age. Pain/Pressure: Present     Pelvic:  Cervical exam deferred        Extremities: Normal range of motion.  Edema: None  Mental Status: Normal mood and affect. Normal behavior. Normal judgment and thought content.   Assessment   38 y.o.  [id number] at [redacted]w[redacted]d by  05/30/2018, by Last Menstrual Period presenting for routine prenatal visit  Plan   Pregnancy #4 Problems (from 08/23/17 to present)    Problem Noted Resolved   Antepartum multigravida of advanced maternal age 27/20/2019 by Natale Milch, MD No   Supervision of other high risk pregnancies, first trimester 10/15/2017 by Natale Milch, MD No   Overview Addendum 01/07/2018 11:10 AM by Natale Milch, MD      [facility] Prenatal Labs  Dating  LMP =9wk [place] Blood type: O/Positive/-- (10/14 1009)   Genetic Screen  NIPS:   Normal xx Antibody:Negative (10/14 1009)  Anatomic [place] complete Rubella: 4.44 (10/14 1009)   Varicella: Immune  GTT Early:        28 wk:      RPR: Non Reactive (10/14 1009)   Rhogam  not needed HBsAg: Negative (10/14 1009)   TDaP vaccine   Declines                    HIV: Non Reactive (10/14 1009)   Flu Shot    Declines                            GBS:   Contraception  Pap: NIL 2019  CBB     CS/VBAC    Baby Food  Breast   Support Person                Hip pain/pressure: positioning, belly band, baths, tylenol or tylenol pm prm Cough: comfort measures, hydration, safe med list given Preterm labor symptoms and general obstetric precautions including but not limited to vaginal bleeding, contractions, leaking of fluid and fetal movement were reviewed in detail with the patient.  Avoid retinol products in pregnancy.  Reassured re: likely low dose of topical products, normal anatomy scan Please refer to After Visit Summary for other counseling recommendations.   Return in about 2 weeks (around 04/04/2018) for rob.  Tresea Mall, CNM 03/21/2018 8:37 AM

## 2018-04-04 ENCOUNTER — Encounter: Payer: Self-pay | Admitting: Certified Nurse Midwife

## 2018-04-04 ENCOUNTER — Ambulatory Visit (INDEPENDENT_AMBULATORY_CARE_PROVIDER_SITE_OTHER): Payer: Medicaid Other | Admitting: Certified Nurse Midwife

## 2018-04-04 VITALS — BP 122/70 | Wt 156.0 lb

## 2018-04-04 DIAGNOSIS — O09529 Supervision of elderly multigravida, unspecified trimester: Secondary | ICD-10-CM

## 2018-04-04 DIAGNOSIS — J069 Acute upper respiratory infection, unspecified: Secondary | ICD-10-CM

## 2018-04-04 DIAGNOSIS — O09523 Supervision of elderly multigravida, third trimester: Secondary | ICD-10-CM

## 2018-04-04 DIAGNOSIS — Z3A32 32 weeks gestation of pregnancy: Secondary | ICD-10-CM

## 2018-04-04 LAB — POCT URINALYSIS DIPSTICK OB
Glucose, UA: NEGATIVE
POC,PROTEIN,UA: NEGATIVE

## 2018-04-04 NOTE — Progress Notes (Signed)
Pt c/o bad cough and cold symptoms, no vb. No lof.

## 2018-04-04 NOTE — Progress Notes (Signed)
ROB at 32 weeks: Has URI x9 days with productive cough, runny nose, ear ache, pain from temple to ear to TM joint when she coughs. Tried Robitussin, but stopped when it was not working Tylenol for pain. Denies fever. Two older children had URIs first, and her youngest has similar symptoms now. Baby active.  Exam: General: gravid WF in NAD HEENT:  Sinuses: no frontal or maxillary tenderness Ears: fluid behind the TMs, no inflammation OP: no exudates or inflammation Neck: no cervical LAN Lungs: CTAB/ normal respiratory effort FHTs 152, FCA 152 + FM. Small umbilical hernia present A: URI IUP at 32 weeks S>D P: Recommend Vicks, Mucinex, Flonase or Claritin/ Zyrtec To RTO for fever, worsening status ROB in 2 weeks. Refused the TDAP.  Farrel Conners, CNM

## 2018-04-08 ENCOUNTER — Telehealth: Payer: Self-pay

## 2018-04-08 NOTE — Telephone Encounter (Signed)
Pt wants to be seen seen - ear pain is excruciating.  [phone] Adv pt we cannot see her b/c m'caid may not pay.  Needs to see PCP or Urgent Care.

## 2018-04-15 ENCOUNTER — Other Ambulatory Visit: Payer: Self-pay

## 2018-04-15 ENCOUNTER — Ambulatory Visit (INDEPENDENT_AMBULATORY_CARE_PROVIDER_SITE_OTHER): Payer: Medicaid Other | Admitting: Obstetrics and Gynecology

## 2018-04-15 ENCOUNTER — Encounter: Payer: Self-pay | Admitting: Obstetrics and Gynecology

## 2018-04-15 VITALS — BP 124/74 | Wt 158.0 lb

## 2018-04-15 DIAGNOSIS — Z3A33 33 weeks gestation of pregnancy: Secondary | ICD-10-CM

## 2018-04-15 DIAGNOSIS — O09891 Supervision of other high risk pregnancies, first trimester: Secondary | ICD-10-CM

## 2018-04-15 DIAGNOSIS — O09523 Supervision of elderly multigravida, third trimester: Secondary | ICD-10-CM

## 2018-04-15 DIAGNOSIS — O09529 Supervision of elderly multigravida, unspecified trimester: Secondary | ICD-10-CM

## 2018-04-15 DIAGNOSIS — O09893 Supervision of other high risk pregnancies, third trimester: Secondary | ICD-10-CM

## 2018-04-15 DIAGNOSIS — F4321 Adjustment disorder with depressed mood: Secondary | ICD-10-CM

## 2018-04-15 NOTE — Progress Notes (Signed)
Routine Prenatal Care Visit  Subjective  Beverly Daniels is a 38 y.o. [phone] at [redacted]w[redacted]d being seen today for ongoing prenatal care.  She is currently monitored for the following issues for this high-risk pregnancy and has Adjustment disorder with depressed mood; Palpitations; Antepartum multigravida of advanced maternal age; and Supervision of other high risk pregnancies, first trimester on their problem list.  ----------------------------------------------------------------------------------- Patient reports no complaints.   Contractions: Irregular. Vag. Bleeding: None.  Movement: Present. Denies leaking of fluid.  ----------------------------------------------------------------------------------- The following portions of the patient's history were reviewed and updated as appropriate: allergies, current medications, past family history, past medical history, past social history, past surgical history and problem list. Problem list updated.   Objective  Blood pressure 124/74, weight 158 lb (71.7 kg), last menstrual period 08/23/2017, unknown if currently breastfeeding. Pregravid weight 145 lb (65.8 kg) Total Weight Gain 13 lb (5.897 kg) Urinalysis: Urine Protein    Urine Glucose    Fetal Status: Fetal Heart Rate (bpm): 150 Fundal Height: 33 cm Movement: Present     General:  Alert, oriented and cooperative. Patient is in no acute distress.  Skin: Skin is warm and dry. No rash noted.   Cardiovascular: Normal heart rate noted  Respiratory: Normal respiratory effort, no problems with respiration noted  Abdomen: Soft, gravid, appropriate for gestational age. Pain/Pressure: Absent     Pelvic:  Cervical exam deferred        Extremities: Normal range of motion.  Edema: None  Mental Status: Normal mood and affect. Normal behavior. Normal judgment and thought content.   Assessment   38 y.o. [id number] at [redacted]w[redacted]d by  05/30/2018, by Last Menstrual Period presenting for routine prenatal visit  Plan    Pregnancy #4 Problems (from 08/23/17 to present)    Problem Noted Resolved   Antepartum multigravida of advanced maternal age 73/20/2019 by Natale Milch, MD No   Supervision of other high risk pregnancies, first trimester 10/15/2017 by Natale Milch, MD No   Overview Addendum 04/04/2018  9:13 AM by Farrel Conners, CNM      [facility] Prenatal Labs  Dating  LMP =9wk [place] Blood type: O/Positive/-- (10/14 1009)   Genetic Screen  NIPS:   Normal xx Antibody:Negative (10/14 1009)  Anatomic [place] complete Rubella: 4.44 (10/14 1009)   Varicella: Immune  GTT Early:        28 wk:    62  RPR: Non Reactive (10/14 1009)   Rhogam  not needed HBsAg: Negative (10/14 1009)   TDaP vaccine   Declines                    HIV: Non Reactive (10/14 1009)   Flu Shot    Declines                            GBS:   Contraception LAM Pap: NIL 2019  CBB     CS/VBAC    Baby Food  Breast   Support Person                 Preterm labor symptoms and general obstetric precautions including but not limited to vaginal bleeding, contractions, leaking of fluid and fetal movement were reviewed in detail with the patient. Please refer to After Visit Summary for other counseling recommendations.   - again declines TDaP  Return in about 2 weeks (around 04/29/2018) for Routine Prenatal Appointment.  Thomasene Mohair, MD, [facility]   [facility] 04/15/2018 9:34 AM

## 2018-04-16 ENCOUNTER — Other Ambulatory Visit: Payer: Self-pay

## 2018-04-16 DIAGNOSIS — F4321 Adjustment disorder with depressed mood: Secondary | ICD-10-CM

## 2018-04-18 MED ORDER — SERTRALINE HCL 100 MG PO TABS: 100.0000 mg | ORAL_TABLET | Freq: Every day | ORAL | 1 refills | Status: DC

## 2018-04-18 NOTE — Telephone Encounter (Signed)
Advise

## 2018-05-02 ENCOUNTER — Ambulatory Visit (INDEPENDENT_AMBULATORY_CARE_PROVIDER_SITE_OTHER): Payer: Medicaid Other | Admitting: Obstetrics & Gynecology

## 2018-05-02 ENCOUNTER — Encounter: Payer: Self-pay | Admitting: Obstetrics & Gynecology

## 2018-05-02 ENCOUNTER — Encounter: Payer: Medicaid Other | Admitting: Obstetrics and Gynecology

## 2018-05-02 ENCOUNTER — Other Ambulatory Visit: Payer: Self-pay

## 2018-05-02 VITALS — BP 118/80 | Wt 160.0 lb

## 2018-05-02 DIAGNOSIS — O09523 Supervision of elderly multigravida, third trimester: Secondary | ICD-10-CM

## 2018-05-02 DIAGNOSIS — Z3685 Encounter for antenatal screening for Streptococcus B: Secondary | ICD-10-CM

## 2018-05-02 DIAGNOSIS — Z3A36 36 weeks gestation of pregnancy: Secondary | ICD-10-CM

## 2018-05-02 DIAGNOSIS — O09529 Supervision of elderly multigravida, unspecified trimester: Secondary | ICD-10-CM

## 2018-05-02 DIAGNOSIS — O0993 Supervision of high risk pregnancy, unspecified, third trimester: Secondary | ICD-10-CM

## 2018-05-02 LAB — POCT URINALYSIS DIPSTICK OB: Glucose, UA: NEGATIVE

## 2018-05-02 NOTE — Progress Notes (Signed)
  Subjective  Fetal Movement? yes Contractions? no Leaking Fluid? no Vaginal Bleeding? no  Objective  BP 118/80   Wt 160 lb (72.6 kg)   LMP 08/23/2017 (Approximate)   BMI 26.63 kg/m  General: NAD Pumonary: no increased work of breathing Abdomen: gravid, non-tender Extremities: no edema Psychiatric: mood appropriate, affect full  Assessment  38 y.o. [id number] at [redacted]w[redacted]d by  05/30/2018, by Last Menstrual Period presenting for routine prenatal visit SVE: 2/65/-3 Plan   Problem List Items Addressed This Visit      Other   Antepartum multigravida of advanced maternal age   High-risk pregnancy, third trimester    Other Visit Diagnoses    [redacted] weeks gestation of pregnancy    -  Primary   Screening, antenatal, for Streptococcus B       Relevant Orders   Culture, beta strep (group b only)      Annamarie Major, MD, [facility], [facility] 05/02/2018  4:28 PM

## 2018-05-02 NOTE — Addendum Note (Signed)
Addended by: Cornelius Moras D on: 05/02/2018 04:34 PM   Modules accepted: Orders

## 2018-05-06 LAB — CULTURE, BETA STREP (GROUP B ONLY): Strep Gp B Culture: NEGATIVE

## 2018-05-09 ENCOUNTER — Encounter: Payer: Medicaid Other | Admitting: Obstetrics and Gynecology

## 2018-05-09 ENCOUNTER — Other Ambulatory Visit: Payer: Self-pay

## 2018-05-16 ENCOUNTER — Encounter: Payer: Self-pay | Admitting: Obstetrics and Gynecology

## 2018-05-16 ENCOUNTER — Other Ambulatory Visit: Payer: Self-pay

## 2018-05-16 ENCOUNTER — Ambulatory Visit (INDEPENDENT_AMBULATORY_CARE_PROVIDER_SITE_OTHER): Payer: Medicaid Other | Admitting: Obstetrics and Gynecology

## 2018-05-16 VITALS — BP 100/60 | Wt 162.0 lb

## 2018-05-16 DIAGNOSIS — O0993 Supervision of high risk pregnancy, unspecified, third trimester: Secondary | ICD-10-CM

## 2018-05-16 DIAGNOSIS — Z3A38 38 weeks gestation of pregnancy: Secondary | ICD-10-CM

## 2018-05-16 DIAGNOSIS — O09523 Supervision of elderly multigravida, third trimester: Secondary | ICD-10-CM

## 2018-05-16 DIAGNOSIS — O09529 Supervision of elderly multigravida, unspecified trimester: Secondary | ICD-10-CM

## 2018-05-16 LAB — POCT URINALYSIS DIPSTICK OB
Glucose, UA: NEGATIVE
POC,PROTEIN,UA: NEGATIVE

## 2018-05-16 NOTE — Progress Notes (Signed)
    Routine Prenatal Care Visit  Subjective  Beverly Daniels is a 38 y.o. [phone] at [redacted]w[redacted]d being seen today for ongoing prenatal care.  She is currently monitored for the following issues for this low-risk pregnancy and has Adjustment disorder with depressed mood; Palpitations; Antepartum multigravida of advanced maternal age; and High-risk pregnancy, third trimester on their problem list.  ----------------------------------------------------------------------------------- Patient reports no complaints.   Contractions: Irregular. Vag. Bleeding: None.  Movement: Present. Denies leaking of fluid.  ----------------------------------------------------------------------------------- The following portions of the patient's history were reviewed and updated as appropriate: allergies, current medications, past family history, past medical history, past social history, past surgical history and problem list. Problem list updated.   Objective  Blood pressure 100/60, weight 162 lb (73.5 kg), last menstrual period 08/23/2017, unknown if currently breastfeeding. Pregravid weight 145 lb (65.8 kg) Total Weight Gain 17 lb (7.711 kg) Urinalysis:      Fetal Status: Fetal Heart Rate (bpm): 130 Fundal Height: 38 cm Movement: Present  Presentation: Vertex  General:  Alert, oriented and cooperative. Patient is in no acute distress.  Skin: Skin is warm and dry. No rash noted.   Cardiovascular: Normal heart rate noted  Respiratory: Normal respiratory effort, no problems with respiration noted  Abdomen: Soft, gravid, appropriate for gestational age. Pain/Pressure: Present     Pelvic:  Cervical exam performed Dilation: 2.5 Effacement (%): 60 Station: -3  Extremities: Normal range of motion.  Edema: None  Mental Status: Normal mood and affect. Normal behavior. Normal judgment and thought content.     Assessment   38 y.o. [id number] at [redacted]w[redacted]d by  05/30/2018, by Last Menstrual Period presenting for routine prenatal  visit  Plan   Pregnancy #4 Problems (from 08/23/17 to present)    Problem Noted Resolved   Antepartum multigravida of advanced maternal age 22/20/2019 by Natale Milch, MD No   High-risk pregnancy, third trimester 10/15/2017 by Natale Milch, MD No   Overview Addendum 04/04/2018  9:13 AM by Farrel Conners, CNM      [facility] Prenatal Labs  Dating  LMP =9wk [place] Blood type: O/Positive/-- (10/14 1009)   Genetic Screen  NIPS:   Normal xx Antibody:Negative (10/14 1009)  Anatomic [place] complete Rubella: 4.44 (10/14 1009)   Varicella: Immune  GTT Early:        28 wk:    62  RPR: Non Reactive (10/14 1009)   Rhogam  not needed HBsAg: Negative (10/14 1009)   TDaP vaccine   Declines                    HIV: Non Reactive (10/14 1009)   Flu Shot    Declines                            GBS:   Contraception LAM Pap: NIL 2019  CBB     CS/VBAC    Baby Food  Breast   Support Person                 Gestational age appropriate obstetric precautions including but not limited to vaginal bleeding, contractions, leaking of fluid and fetal movement were reviewed in detail with the patient.    Return in about 2 weeks (around 05/30/2018) for ROB in person.  Natale Milch MD [facility], [facility] 05/16/2018, 4:40 PM

## 2018-05-16 NOTE — Patient Instructions (Signed)
 COVID-19 and Your Pregnancy FAQ  How can I prevent infection with COVID-19 during my pregnancy? Social distancing is key. Please limit any interactions in public. Try and work from home if possible. Frequently wash your hands after touching possibly contaminated surfaces. Avoid touching your face.  Minimize trips to the store. Consider online ordering when possible.   Should I wear a mask? YES. It is recommended by the CDC that all people wear a cloth mask or facial covering in public. This will help reduce transmission as well as your risk or acquiring COVID-19. New studies are showing that even asymptomatic individuals can spread the virus from talking.   What are the symptoms of COVID-19? Fever (greater than 100.4 F), dry cough, shortness of breath.  Am I more at risk for COVID-19 since I am pregnant? There is not currently data showing that pregnant women are more adversely impacted by COVID-19 than the general population. However, we know that pregnant women tend to have worse respiratory complications from similar diseases such as the flu and SARS and for this reason should be considered an at-risk population.  What do I do if I am experiencing the symptoms of COVID-19? Testing is being limited because of test availability. If you are experiencing symptoms you should quarantine yourself, and the members of your family, for at least 2 weeks at home.   Please visit this website for more information: https://www.cdc.gov/coronavirus/2019-ncov/if-you-are-sick/steps-when-sick.html  When should I go to the Emergency Room? Please go to the emergency room if you are experiencing ANY of these symptoms*:  1.    Difficulty breathing or shortness of breath 2.    Persistent pain or pressure in the chest 3.    Confusion or difficulty being aroused (or awakened) 4.    Bluish lips or face  *This list is not all inclusive. Please consult our office for any other symptoms that are severe or  concerning.  What do I do if I am having difficulty breathing? You should go to the Emergency Room for evaluation. At this time they have a tent set up for evaluating patients with COVID-19 symptoms.   How will my prenatal care be different because of the COVID-19 pandemic? It has been recommended to reduce the frequency of face-to-face visits and use resources such as telephone and virtual visits when possible. Using a scale, blood pressure machine and fetal doppler at home can further help reduce face-to-face visits. You will be provided with additional information on this topic.  We ask that you come to your visits alone to minimize potential exposures to  COVID-19.  How can I receive childbirth education? At this time in-person classes have been cancelled. You can register for online childbirth education, breastfeeding, and newborn care classes.  Please visit:  [email or web address] for more information  How will my hospital birth experience be different? The hospital is currently limiting visitors. This means that while you are in labor you can only have one person at the hospital with you. Additional family members will not be allowed to wait in the building or outside your room. Your one support person can be the father of the baby, a relative, a doula, or a friend. Once one support person is designated that person will wear a band. This band cannot be shared with multiple people.  Nitrous Gas is not being offered for pain relief since the tubing and filter for the machine can not be sanitized in a way to guarantee prevention of transmission of COVID-19.    Nasal cannula use of oxygen for fetal indications has also been discontinued.  Currently a clear plastic sheet is being hung between mom and the delivering provider during pushing and delivery to help prevent transmission of COVID-19.      How long will I stay in the hospital for after giving birth? It is also recommended that  discharge home be expedited during the COVID-19 outbreak. This means staying for 1 day after a vaginal delivery and 2 days after a cesarean section. Patients who need to stay longer for medical reasons are allowed to do so, but the goal will be for expedited discharge home.   What if I have COVID-19 and I am in labor? We ask that you wear a mask while on labor and delivery. We will try and accommodate you being placed in a room that is capable of filtering the air. Please call ahead if you are in labor and on your way to the hospital. The phone number for labor and delivery at [facility] is [phone].  If I have COVID-19 when my baby is born how can I prevent my baby from contracting COVID-19? This is an issue that will have to be discussed on a case-by-case basis. Current recommendations suggest providing separate isolation rooms for both the mother and new infant as well as limiting visitors. However, there are practical challenges to this recommendation. The situation will assuredly change and decisions will be influenced by the desires of the mother and availability of space.  Some suggestions are the use of a curtain or physical barrier between mom and infant, hand hygiene, mom wearing a mask, or 6 feet of spacing between a mom and infant.   Can I breastfeed during the COVID-19 pandemic?   Yes, breastfeeding is encouraged.  Can I breastfeed if I have COVID-19? Yes. Covid-19 has not been found in breast milk. This means you cannot give COVID-19 to your child through breast milk. Breast feeding will also help pass antibodies to fight infection to your baby.   What precautions should I take when breastfeeding if I have COVID-19? If a mother and newborn do room-in and the mother wishes to feed at the breast, she should put on a facemask and practice hand hygiene before each feeding.  What precautions should I take when pumping if I have COVID-19? Prior to expressing  breast milk, mothers should practice hand hygiene. After each pumping session, all parts that come into contact with breast milk should be thoroughly washed and the entire pump should be appropriately disinfected per the manufacturer's instructions. This expressed breast milk should be fed to the newborn by a healthy caregiver.  What if I am pregnant and work in healthcare? Based on limited data regarding COVID-19 and pregnancy, ACOG currently does not propose creating additional restrictions on pregnant health care personnel because of COVID-19 alone. Pregnant women do not appear to be at higher risk of severe disease related to COVID-19. Pregnant health care personnel should follow CDC risk assessment and infection control guidelines for health care personnel exposed to patients with suspected or confirmed COVID-19. Adherence to recommended infection prevention and control practices is an important part of protecting all health care personnel in health care settings.    Information on COVID-19 in pregnancy is very limited; however, facilities may want to consider limiting exposure of pregnant health care personnel to patients with confirmed or suspected COVID-19 infection, especially during higher-risk procedures (eg, aerosol-generating procedures), if feasible, based on staffing availability.     

## 2018-05-19 ENCOUNTER — Encounter: Payer: Self-pay | Admitting: *Deleted

## 2018-05-19 ENCOUNTER — Other Ambulatory Visit: Payer: Self-pay

## 2018-05-19 ENCOUNTER — Inpatient Hospital Stay
Admission: EM | Admit: 2018-05-19 | Discharge: 2018-05-21 | DRG: 807 | Disposition: A | Discharge status: Home / Self Care | Payer: Medicaid Other | Attending: Obstetrics & Gynecology | Admitting: Obstetrics & Gynecology

## 2018-05-19 DIAGNOSIS — O0993 Supervision of high risk pregnancy, unspecified, third trimester: Secondary | ICD-10-CM

## 2018-05-19 DIAGNOSIS — Z3A38 38 weeks gestation of pregnancy: Secondary | ICD-10-CM | POA: Diagnosis not present

## 2018-05-19 DIAGNOSIS — O99344 Other mental disorders complicating childbirth: Secondary | ICD-10-CM | POA: Diagnosis present

## 2018-05-19 DIAGNOSIS — F419 Anxiety disorder, unspecified: Secondary | ICD-10-CM | POA: Diagnosis present

## 2018-05-19 DIAGNOSIS — O26893 Other specified pregnancy related conditions, third trimester: Secondary | ICD-10-CM | POA: Diagnosis present

## 2018-05-19 DIAGNOSIS — O09529 Supervision of elderly multigravida, unspecified trimester: Secondary | ICD-10-CM

## 2018-05-19 LAB — CBC
HCT: 36.9 % (ref 36.0–46.0)
Hemoglobin: 12.4 g/dL (ref 12.0–15.0)
MCH: 30.2 pg (ref 26.0–34.0)
MCHC: 33.6 g/dL (ref 30.0–36.0)
MCV: 90 fL (ref 80.0–100.0)
Platelets: 248 10*3/uL (ref 150–400)
RBC: 4.1 MIL/uL (ref 3.87–5.11)
RDW: 13.6 % (ref 11.5–15.5)
WBC: 14.4 10*3/uL — ABNORMAL HIGH (ref 4.0–10.5)
nRBC: 0 % (ref 0.0–0.2)

## 2018-05-19 MED ORDER — ONDANSETRON HCL 4 MG/2ML IJ SOLN: 4.0000 mg | Freq: Four times a day (QID) | INTRAMUSCULAR | Status: DC | PRN

## 2018-05-19 MED ORDER — LIDOCAINE HCL (PF) 1 % IJ SOLN: 30.0000 mL | INTRAMUSCULAR | Status: DC | PRN

## 2018-05-19 MED ORDER — BUTORPHANOL TARTRATE 1 MG/ML IJ SOLN: 1.0000 mg | INTRAMUSCULAR | Status: DC | PRN

## 2018-05-19 MED ORDER — ACETAMINOPHEN 325 MG PO TABS
650.0000 mg | ORAL_TABLET | ORAL | Status: DC | PRN
  Administered 2018-05-20 (×2): 650 mg via ORAL
  Filled 2018-05-19: qty 2

## 2018-05-19 MED ORDER — OXYTOCIN 40 UNITS IN NORMAL SALINE INFUSION - SIMPLE MED
INTRAVENOUS | Status: AC
  Filled 2018-05-19: qty 1000

## 2018-05-19 MED ORDER — LIDOCAINE HCL (PF) 1 % IJ SOLN
INTRAMUSCULAR | Status: AC
  Filled 2018-05-19: qty 30

## 2018-05-19 MED ORDER — OXYTOCIN 10 UNIT/ML IJ SOLN
INTRAMUSCULAR | Status: AC
  Filled 2018-05-19: qty 2

## 2018-05-19 MED ORDER — LACTATED RINGERS IV SOLN: 500.0000 mL | INTRAVENOUS | Status: DC | PRN

## 2018-05-19 MED ORDER — OXYTOCIN 40 UNITS IN NORMAL SALINE INFUSION - SIMPLE MED: 2.5000 [IU]/h | INTRAVENOUS | Status: DC

## 2018-05-19 MED ORDER — IBUPROFEN 600 MG PO TABS
600.0000 mg | ORAL_TABLET | Freq: Four times a day (QID) | ORAL | Status: DC
  Administered 2018-05-20 – 2018-05-21 (×8): 600 mg via ORAL
  Filled 2018-05-19 (×8): qty 1

## 2018-05-19 MED ORDER — AMMONIA AROMATIC IN INHA
RESPIRATORY_TRACT | Status: AC
  Filled 2018-05-19: qty 10

## 2018-05-19 MED ORDER — LACTATED RINGERS IV SOLN: INTRAVENOUS | Status: DC

## 2018-05-19 MED ORDER — MISOPROSTOL 200 MCG PO TABS
ORAL_TABLET | ORAL | Status: AC
  Filled 2018-05-19: qty 4

## 2018-05-19 MED ORDER — OXYTOCIN BOLUS FROM INFUSION: 500.0000 mL | Freq: Once | INTRAVENOUS | Status: DC

## 2018-05-19 NOTE — Discharge Summary (Signed)
OB Discharge Summary     Patient Name: Beverly BullaSarah G Daniels DOB: 1980/12/01 MRN: [id number]  Date of admission: 05/19/2018 Delivering MD: Letitia Libraobert Paul Harris, MD  Date of Delivery: 05/19/2018  Date of discharge: 05/21/2018  Admitting diagnosis: [redacted] wks pregnant contractions Intrauterine pregnancy: 4927w3d     Secondary diagnosis: None     Discharge diagnosis: Term Pregnancy Delivered, Precipitous Labor and delivery                         Hospital course:  Onset of Labor With Vaginal Delivery     38 y.o. yo [id number] at 2027w3d was admitted in Active Labor on 05/19/2018. Patient had an uncomplicated labor course as follows:  Membrane Rupture Time/Date: 10:44 PM ,05/19/2018   Intrapartum Procedures: Episiotomy: None [1]                                         Lacerations:  None [1]  Patient had a delivery of a Viable infant. This patient has no babies on file. Information for the patient's newborn:  Vickki MuffGuetterman, Girl Mariateresa [[id number]][[id number]]  Delivery Method: Vag-Spont    Pateint had an uncomplicated postpartum course.  She is ambulating, tolerating a regular diet, passing flatus, and urinating well. Patient is discharged home in stable condition on 05/21/18.                                                                   Post partum procedures:none  Complications: None  Physical exam on 05/21/2018: Vitals:   05/20/18 1201 05/20/18 1608 05/20/18 2316 05/21/18 0832  BP: 111/64 105/65 111/61 95/64  Pulse: 61 67 75   Resp: 20 20 18 18   Temp: 98.2 F (36.8 C) 98 F (36.7 C) 98.3 F (36.8 C) 97.9 F (36.6 C)  TempSrc: Oral Oral Oral Oral  SpO2:  100% 99% 98%  Weight:      Height:       General: alert, cooperative and no distress Lochia: appropriate Uterine Fundus: firm Incision: N/A DVT Evaluation: No evidence of DVT seen on physical exam.  Labs: Lab Results  Component Value Date   WBC 15.2 (H) 05/20/2018   HGB 10.7 (L) 05/20/2018   HCT 32.0 (L) 05/20/2018   MCV 91.7 05/20/2018    PLT 219 05/20/2018   CMP Latest Ref Rng & Units 04/11/2016  Glucose 65 - 99 mg/dL -  BUN 7 - 18 mg/dL -  Creatinine 1.300.44 - 8.651.00 mg/dL 7.840.51  Sodium 696136 - 295145 mmol/L -  Potassium 3.5 - 5.1 mmol/L -  Chloride 98 - 107 mmol/L -  CO2 21 - 32 mmol/L -  Calcium 8.5 - 10.1 mg/dL -  Total Protein 6.4 - 8.2 g/dL -  Total Bilirubin 0.2 - 1.0 mg/dL -  Alkaline Phos 50 - 284136 Unit/L -  AST 15 - 37 Unit/L -  ALT U/L -    Discharge instruction: per After Visit Summary.  Medications:  Allergies as of 05/21/2018      Reactions   Amoxicillin Swelling, Rash, Itching   Swelling on face; itchy all over; rash all over as well   Cortisone Palpitations, Other (  See Comments)   Patient passed out after cortisone shot.       Medication List    TAKE these medications   acetaminophen 325 MG tablet Commonly known as:  Tylenol Take 2 tablets (650 mg total) by mouth every 4 (four) hours as needed (for pain scale < 4).   ibuprofen 600 MG tablet Commonly known as:  ADVIL Take 1 tablet (600 mg total) by mouth every 6 (six) hours.   multivitamin-prenatal 27-0.8 MG Tabs tablet Take 1 tablet by mouth daily at 12 noon.   sertraline 100 MG tablet Commonly known as:  ZOLOFT Take 1 tablet (100 mg total) by mouth daily.            Discharge Care Instructions  (From admission, onward)         Start     Ordered   05/21/18 0000  Discharge wound care:    Comments:  SHOWER DAILY Wash incision gently with soap and water.  Call office with any drainage, redness, or firmness of the incision.   05/21/18 0940          Diet: routine diet  Activity: Advance as tolerated. Pelvic rest for 6 weeks.   Outpatient follow up: Follow-up Information    Nadara Mustard, MD. Schedule an appointment as soon as possible for a visit in 6 week(s).   Specialty:  Obstetrics and Gynecology Contact information: [address] [place] [address] [phone]             Postpartum contraception:  Natural Family Planning Rhogam Given postpartum: no Rubella vaccine given postpartum: no Varicella vaccine given postpartum: no TDaP given antepartum or postpartum: No  Newborn Data: This patient has no babies on file.  Baby Feeding: Breast  Disposition:rooming in  SIGNED: Natale Milch, MD 05/21/2018 9:41 AM

## 2018-05-19 NOTE — Discharge Instructions (Signed)
Vaginal Delivery, Care After °Refer to this sheet in the next few weeks. These instructions provide you with information about caring for yourself after vaginal delivery. Your health care provider may also give you more specific instructions. Your treatment has been planned according to current medical practices, but problems sometimes occur. Call your health care provider if you have any problems or questions. °What can I expect after the procedure? °After vaginal delivery, it is common to have: °· Some bleeding from your vagina. °· Soreness in your abdomen, your vagina, and the area of skin between your vaginal opening and your anus (perineum). °· Pelvic cramps. °· Fatigue. °Follow these instructions at home: °Medicines °· Take over-the-counter and prescription medicines only as told by your health care provider. °· If you were prescribed an antibiotic medicine, take it as told by your health care provider. Do not stop taking the antibiotic until it is finished. °Driving ° °· Do not drive or operate heavy machinery while taking prescription pain medicine. °· Do not drive for 24 hours if you received a sedative. °Lifestyle °· Do not drink alcohol. This is especially important if you are breastfeeding or taking medicine to relieve pain. °· Do not use tobacco products, including cigarettes, chewing tobacco, or e-cigarettes. If you need help quitting, ask your health care provider. °Eating and drinking °· Drink at least 8 eight-ounce glasses of water every day unless you are told not to by your health care provider. If you choose to breastfeed your baby, you may need to drink more water than this. °· Eat high-fiber foods every day. These foods may help prevent or relieve constipation. High-fiber foods include: °? Whole grain cereals and breads. °? Brown rice. °? Beans. °? Fresh fruits and vegetables. °Activity °· Return to your normal activities as told by your health care provider. Ask your health care provider what  activities are safe for you. °· Rest as much as possible. Try to rest or take a nap when your baby is sleeping. °· Do not lift anything that is heavier than your baby or 10 lb (4.5 kg) until your health care provider says that it is safe. °· Talk with your health care provider about when you can engage in sexual activity. This may depend on your: °? Risk of infection. °? Rate of healing. °? Comfort and desire to engage in sexual activity. °Vaginal Care °· If you have an episiotomy or a vaginal tear, check the area every day for signs of infection. Check for: °? More redness, swelling, or pain. °? More fluid or blood. °? Warmth. °? Pus or a bad smell. °· Do not use tampons or douches until your health care provider says this is safe. °· Watch for any blood clots that may pass from your vagina. These may look like clumps of dark red, brown, or black discharge. °General instructions °· Keep your perineum clean and dry as told by your health care provider. °· Wear loose, comfortable clothing. °· Wipe from front to back when you use the toilet. °· Ask your health care provider if you can shower or take a bath. If you had an episiotomy or a perineal tear during labor and delivery, your health care provider may tell you not to take baths for a certain length of time. °· Wear a bra that supports your breasts and fits you well. °· If possible, have someone help you with household activities and help care for your baby for at least a few days after you   leave the hospital. °· Keep all follow-up visits for you and your baby as told by your health care provider. This is important. °Contact a health care provider if: °· You have: °? Vaginal discharge that has a bad smell. °? Difficulty urinating. °? Pain when urinating. °? A sudden increase or decrease in the frequency of your bowel movements. °? More redness, swelling, or pain around your episiotomy or vaginal tear. °? More fluid or blood coming from your episiotomy or vaginal  tear. °? Pus or a bad smell coming from your episiotomy or vaginal tear. °? A fever. °? A rash. °? Little or no interest in activities you used to enjoy. °? Questions about caring for yourself or your baby. °· Your episiotomy or vaginal tear feels warm to the touch. °· Your episiotomy or vaginal tear is separating or does not appear to be healing. °· Your breasts are painful, hard, or turn red. °· You feel unusually sad or worried. °· You feel nauseous or you vomit. °· You pass large blood clots from your vagina. If you pass a blood clot from your vagina, save it to show to your health care provider. Do not flush blood clots down the toilet without having your health care provider look at them. °· You urinate more than usual. °· You are dizzy or light-headed. °· You have not breastfed at all and you have not had a menstrual period for 12 weeks after delivery. °· You have stopped breastfeeding and you have not had a menstrual period for 12 weeks after you stopped breastfeeding. °Get help right away if: °· You have: °? Pain that does not go away or does not get better with medicine. °? Chest pain. °? Difficulty breathing. °? Blurred vision or spots in your vision. °? Thoughts about hurting yourself or your baby. °· You develop pain in your abdomen or in one of your legs. °· You develop a severe headache. °· You faint. °· You bleed from your vagina so much that you fill two sanitary pads in one hour. °This information is not intended to replace advice given to you by your health care provider. Make sure you discuss any questions you have with your health care provider. °Document Released: 01/10/2000 Document Revised: 06/26/2015 Document Reviewed: 01/27/2015 °Elsevier Interactive Patient Education © 2019 [employer] ° °

## 2018-05-19 NOTE — OB Triage Note (Signed)
Recvd pt from ED. Pt c/o contractions every 5 min that started around 1500. No vaginal bleeding or LOF. Feeling baby move well. Rates pain a 7 out of 10.

## 2018-05-19 NOTE — H&P (Signed)
Obstetrics Admission History & Physical   CC: Painful ctxs  HPI:  38 y.o. [id number] @ [redacted]w[redacted]d (05/30/2018, by Last Menstrual Period). Admitted on 05/19/2018:   Patient Active Problem List   Diagnosis Date Noted  . Labor, precipitous 05/19/2018  . Antepartum multigravida of advanced maternal age 38/20/2019  . High-risk pregnancy, third trimester 10/15/2017  . Palpitations 02/25/2016  . Adjustment disorder with depressed mood 07/05/2012     Presents for painful ctxs this evening, no ROM or VB.  No other concerns.   Prenatal care at: at [facility]. Pregnancy complicated by AMA.  ROS: A review of systems was performed and negative, except as stated in the above HPI. PMHx:  Past Medical History:  Diagnosis Date  . Anxiety   . Depression   . Ectopic pregnancy    PSHx:  Past Surgical History:  Procedure Laterality Date  . LAPAROSCOPIC OVARIAN CYSTECTOMY Left 07/09/2011  . salpingectomy Left    Medications:  Medications Prior to Admission  Medication Sig Dispense Refill Last Dose  . Prenatal Vit-Fe Fumarate-FA (MULTIVITAMIN-PRENATAL) 27-0.8 MG TABS tablet Take 1 tablet by mouth daily at 12 noon.   05/19/2018 at Unknown time  . sertraline (ZOLOFT) 100 MG tablet Take 1 tablet (100 mg total) by mouth daily. 90 tablet 1 05/19/2018 at Unknown time   Allergies: is allergic to amoxicillin and cortisone. OBHx:  OB History  Gravida Para Term Preterm AB Living  5 3 3   1 3   SAB TAB Ectopic Multiple Live Births      1   3    # Outcome Date GA Lbr Len/2nd Weight Sex Delivery Anes PTL Lv  5 Current           4 Term 04/11/16 [redacted]w[redacted]d  3374 g M Vag-Spont   LIV  3 Term 04/20/12 [redacted]w[redacted]d  3033 g F Vag-Spont   LIV  2 Ectopic 2014     ECTOPIC     1 Term 06/23/09 [redacted]w[redacted]d  2750 g F Vag-Spont  N LIV   NLZ:JQBHALPF/[id number] except as detailed in HPI.Marland Kitchen  No family history of birth defects. Soc Hx: Alcohol: none and Recreational drug use: none  Objective:  There were no vitals filed for this  visit. Constitutional: Well nourished, well developed female in no acute distress.  HEENT: normal Skin: Warm and dry.  Cardiovascular:Regular rate and rhythm.   Extremity: trace to 1+ bilateral pedal edema Respiratory: Clear to auscultation bilateral. Normal respiratory effort Abdomen: gravid, ND, FHT present, marked tenderness on exam Back: no CVAT Neuro: DTRs 2+, Cranial nerves grossly intact Psych: Alert and Oriented x3. No memory deficits. Normal mood and affect.  MS: normal gait, normal bilateral lower extremity ROM/strength/stability.  Pelvic exam: is not limited by body habitus EGBUS: within normal limits Vagina: within normal limits and with normal mucosa Cervix: CERVIX: 8 cm dilated Uterus: Spontaneous uterine activity  Adnexa: not evaluated  EFM:FHR: 140 bpm, variability: moderate,  accelerations:  Present,  decelerations:  Absent Toco: Frequency: Every 3-4 minutes   Perinatal info:  Blood type: O positive Rubella- Immune Varicella -Immune TDaP Given during third trimester of this pregnancy RPR NR / HIV Neg/ HBsAg Neg  GBS NEG  Assessment & Plan:   38 y.o. [id number] @ [redacted]w[redacted]d, Admitted on 05/19/2018: Active labor at 8 cm on arrival Anticipate precipitous delivery    Admit for labor, Observe for cervical change, Fetal Wellbeing Reassuring and AROM when Appropriate  Annamarie Major, MD, [facility], [facility] 05/19/2018  10:26 PM

## 2018-05-20 LAB — TYPE AND SCREEN
ABO/RH(D): O POS
Antibody Screen: NEGATIVE

## 2018-05-20 LAB — CBC
HCT: 32 % — ABNORMAL LOW (ref 36.0–46.0)
Hemoglobin: 10.7 g/dL — ABNORMAL LOW (ref 12.0–15.0)
MCH: 30.7 pg (ref 26.0–34.0)
MCHC: 33.4 g/dL (ref 30.0–36.0)
MCV: 91.7 fL (ref 80.0–100.0)
Platelets: 219 10*3/uL (ref 150–400)
RBC: 3.49 MIL/uL — ABNORMAL LOW (ref 3.87–5.11)
RDW: 13.7 % (ref 11.5–15.5)
WBC: 15.2 10*3/uL — ABNORMAL HIGH (ref 4.0–10.5)
nRBC: 0 % (ref 0.0–0.2)

## 2018-05-20 MED ORDER — OXYCODONE-ACETAMINOPHEN 5-325 MG PO TABS: 1.0000 | ORAL_TABLET | ORAL | Status: DC | PRN

## 2018-05-20 MED ORDER — DIBUCAINE (PERIANAL) 1 % EX OINT: 1.0000 "application " | TOPICAL_OINTMENT | CUTANEOUS | Status: DC | PRN

## 2018-05-20 MED ORDER — SODIUM CHLORIDE 0.9 % IV SOLN: 250.0000 mL | INTRAVENOUS | Status: DC | PRN

## 2018-05-20 MED ORDER — COCONUT OIL OIL: 1.0000 "application " | TOPICAL_OIL | Status: DC | PRN

## 2018-05-20 MED ORDER — ACETAMINOPHEN 325 MG PO TABS
650.0000 mg | ORAL_TABLET | ORAL | Status: DC | PRN
  Administered 2018-05-21: 650 mg via ORAL
  Filled 2018-05-20 (×2): qty 2

## 2018-05-20 MED ORDER — ZOLPIDEM TARTRATE 5 MG PO TABS: 5.0000 mg | ORAL_TABLET | Freq: Every evening | ORAL | Status: DC | PRN

## 2018-05-20 MED ORDER — DIPHENHYDRAMINE HCL 25 MG PO CAPS: 25.0000 mg | ORAL_CAPSULE | Freq: Four times a day (QID) | ORAL | Status: DC | PRN

## 2018-05-20 MED ORDER — ONDANSETRON HCL 4 MG/2ML IJ SOLN: 4.0000 mg | INTRAMUSCULAR | Status: DC | PRN

## 2018-05-20 MED ORDER — SIMETHICONE 80 MG PO CHEW: 80.0000 mg | CHEWABLE_TABLET | ORAL | Status: DC | PRN

## 2018-05-20 MED ORDER — SENNOSIDES-DOCUSATE SODIUM 8.6-50 MG PO TABS
2.0000 | ORAL_TABLET | ORAL | Status: DC
  Administered 2018-05-20: 09:00:00 2 via ORAL
  Filled 2018-05-20 (×2): qty 2

## 2018-05-20 MED ORDER — BENZOCAINE-MENTHOL 20-0.5 % EX AERO: 1.0000 "application " | INHALATION_SPRAY | CUTANEOUS | Status: DC | PRN

## 2018-05-20 MED ORDER — WITCH HAZEL-GLYCERIN EX PADS: 1.0000 "application " | MEDICATED_PAD | CUTANEOUS | Status: DC | PRN

## 2018-05-20 MED ORDER — OXYCODONE-ACETAMINOPHEN 5-325 MG PO TABS: 2.0000 | ORAL_TABLET | ORAL | Status: DC | PRN

## 2018-05-20 MED ORDER — ONDANSETRON HCL 4 MG PO TABS: 4.0000 mg | ORAL_TABLET | ORAL | Status: DC | PRN

## 2018-05-20 MED ORDER — SERTRALINE HCL 100 MG PO TABS
100.0000 mg | ORAL_TABLET | Freq: Every day | ORAL | Status: DC
  Administered 2018-05-20: 100 mg via ORAL
  Filled 2018-05-20 (×2): qty 1

## 2018-05-20 MED ORDER — SODIUM CHLORIDE 0.9% FLUSH: 3.0000 mL | INTRAVENOUS | Status: DC | PRN

## 2018-05-20 MED ORDER — SODIUM CHLORIDE 0.9% FLUSH: 3.0000 mL | Freq: Two times a day (BID) | INTRAVENOUS | Status: DC

## 2018-05-20 MED ORDER — CLONAZEPAM 0.5 MG PO TABS
0.5000 mg | ORAL_TABLET | Freq: Every evening | ORAL | Status: DC | PRN
  Administered 2018-05-20: 22:00:00 0.5 mg via ORAL
  Filled 2018-05-20: qty 1

## 2018-05-20 NOTE — Lactation Note (Addendum)
This note was copied from a baby's chart. Lactation Consultation Note  Patient Name: Girl Calianna Baxley [id number] Date: 05/20/2018  Lactation rounds, mom states baby is latching and nursing well, this is mom's 5th breastfed baby, last child breastfed x 18 mths, I offered assistance if needed with breastfeeding, mom stated that she does not need assist at this time, I wrote my name and AScom #  on white board and requested that mom call for any questions or concerns, I encouraged use of skin to skin for Baby Friendly, mom had baby wrapped in blankets at her side   Maternal Data    Feeding Feeding Type: Breast Fed  [facility] Score                   Interventions    Lactation Tools Discussed/Used     Consult Status      Dyann Kief 05/20/2018, 7:01 PM

## 2018-05-20 NOTE — Progress Notes (Signed)
Post Partum Day 1 Subjective: Doing well, no complaints.  Tolerating regular diet, pain with PO meds, voiding and ambulating without difficulty.  No CP SOB F/C N/V or leg pain No HA, change of vision, RUQ/epigastric pain  Objective: BP 107/63 (BP Location: Right Arm)   Pulse 62   Temp 98.4 F (36.9 C) (Oral)   Resp 18   Ht 5\' 5"  (1.651 m)   Wt 73 kg   LMP 08/23/2017 (Approximate)   SpO2 100% Comment: ROOM AIR  Breastfeeding  BMI 26.78 kg/m    Physical Exam:  General: NAD CV: RRR Pulm: nl effort, CTABL Lochia: moderate Uterine Fundus: fundus firm and below umbilicus DVT Evaluation: no cords, ttp LEs   Recent Labs    05/19/18 2245 05/20/18 0607  HGB 12.4 10.7*  HCT 36.9 32.0*  WBC 14.4* 15.2*  PLT 248 219    Assessment/Plan: 38 y.o. [id number] postpartum day # 1  1. Continue routine postpartum care 2. O positive, Rubella Immune, Varicella Immune 3. TDAP declined 4. Breastfeeding 5. Contraception: LAM 6. Disposition: discharge tomorrow   Parke Poisson, CNM [facility] 05/20/2018, 9:38 AM

## 2018-05-21 LAB — RPR: RPR Ser Ql: NONREACTIVE

## 2018-05-21 MED ORDER — ACETAMINOPHEN 325 MG PO TABS: 650.0000 mg | ORAL_TABLET | ORAL | 3 refills | Status: DC | PRN

## 2018-05-21 MED ORDER — IBUPROFEN 600 MG PO TABS: 600.0000 mg | ORAL_TABLET | Freq: Four times a day (QID) | ORAL | 0 refills | Status: DC

## 2018-05-21 NOTE — Progress Notes (Signed)
Patient ID: Beverly Daniels, female   DOB: 1980-04-15, 38 y.o.   MRN: [id number] Admit Date: 05/19/2018 Today's Date: 05/21/2018  Post Partum Day 2  Subjective:  no complaints, up ad lib, voiding, tolerating PO, + flatus and bowel movement  Objective: Temp:  [97.9 F (36.6 C)-98.3 F (36.8 C)] 97.9 F (36.6 C) (04/25 0832) Pulse Rate:  [61-75] 75 (04/24 2316) Resp:  [18-20] 18 (04/25 0832) BP: (95-111)/(61-65) 95/64 (04/25 0832) SpO2:  [98 %-100 %] 98 % (04/25 4098)  Physical Exam:  General: alert, cooperative and appears stated age Lochia: appropriate Uterine Fundus: firm Incision: none DVT Evaluation: No evidence of DVT seen on physical exam.  Recent Labs    05/19/18 2245 05/20/18 0607  HGB 12.4 10.7*  HCT 36.9 32.0*    Assessment/Plan: 38 y.o. [id number] postpartum day # 1  1. Continue routine postpartum care 2. O positive, Rubella Immune, Varicella Immune 3. TDAP declined 4. Breastfeeding 5. Contraception: none 6. Disposition: discharge home today, mom will room in with baby for elevated bilirubin   LOS: 2 days   Jaleena Viviani R [facility] 05/21/2018, 9:37 AM

## 2018-05-21 NOTE — Progress Notes (Signed)
Patient ready for discharge today. Possibility that infant will remain a patient due to hyperbilirubinemia and being under phototherapy. RN explained discharge process and "rooming in" to patient, and that patient would be responsible for her own medicine. Patient and husband verbalize understanding of teaching.

## 2018-05-21 NOTE — Progress Notes (Signed)
Discharge instructions given. Patient verbalizes understanding of teaching.  

## 2018-05-22 ENCOUNTER — Other Ambulatory Visit: Payer: Self-pay | Admitting: Obstetrics and Gynecology

## 2018-05-22 DIAGNOSIS — O99345 Other mental disorders complicating the puerperium: Principal | ICD-10-CM

## 2018-05-22 DIAGNOSIS — F53 Postpartum depression: Secondary | ICD-10-CM

## 2018-05-22 MED ORDER — CLONAZEPAM 0.5 MG PO TABS: 0.5000 mg | ORAL_TABLET | Freq: Two times a day (BID) | ORAL | 1 refills | Status: DC | PRN

## 2018-05-22 NOTE — Progress Notes (Signed)
Beverly Daniels called nursing line and needed a prescription for klonopin. Has a history of postpartum anxiety and depression. She is taking zoloft. Klonopin has helped her in the past. Will call the office tomorrow ans schedule a follow up phone visit for this week. RX sent to pharmacy.   [name] MD [facility], [facility] 05/22/2018 3:56 PM

## 2018-05-30 ENCOUNTER — Encounter: Payer: Medicaid Other | Admitting: Obstetrics and Gynecology

## 2018-06-09 ENCOUNTER — Other Ambulatory Visit: Payer: Self-pay | Admitting: Obstetrics and Gynecology

## 2018-06-09 ENCOUNTER — Telehealth: Payer: Self-pay

## 2018-06-09 MED ORDER — CEPHALEXIN 500 MG PO CAPS: 500.0000 mg | ORAL_CAPSULE | Freq: Three times a day (TID) | ORAL | 0 refills | Status: DC

## 2018-06-09 NOTE — Telephone Encounter (Signed)
Pt called triage saying she is pretty sure she has mastitis. Says all this started about an hr and a half ago. Right breast is in a lot of pain and can feel a lump. Hasnt taken a look at the breast but can feel lump. She was out and about running errands. Says she probably has a fever (hasnt checked her temp. Does not want to come in office, can something be called in? Please advise.

## 2018-06-09 NOTE — Progress Notes (Signed)
Calling with fevers chills breast tenderness. Declines visit.  Discussed marking borders to monitor progression resolution.  Given PCN allergy rx keflex.  Discussed some PCN allergies may also react to cephalosporin.  Given within 1 month of delivery would avoid bactrim because of risk of kernicterus.  If no improvement switch to clindamycin for MRSA coverage.  Follow up 1 week to monitor for improvement

## 2018-06-14 ENCOUNTER — Telehealth: Payer: Self-pay | Admitting: Obstetrics & Gynecology

## 2018-06-14 NOTE — Telephone Encounter (Signed)
Called and left voicemail for patient to call back to confirm schedule appointment  °

## 2018-06-14 NOTE — Telephone Encounter (Signed)
-----   Message from Vena Austria, MD sent at 06/09/2018  5:20 PM EDT ----- Regarding: Mastitis follow up [facility] Follow up Dr. Tiburcio Pea 1 week mastitis post treatment

## 2018-06-16 ENCOUNTER — Ambulatory Visit: Payer: Medicaid Other | Admitting: Obstetrics & Gynecology

## 2018-06-30 ENCOUNTER — Encounter: Payer: Self-pay | Admitting: Obstetrics & Gynecology

## 2018-06-30 ENCOUNTER — Ambulatory Visit (INDEPENDENT_AMBULATORY_CARE_PROVIDER_SITE_OTHER): Payer: Medicaid Other | Admitting: Obstetrics & Gynecology

## 2018-06-30 ENCOUNTER — Other Ambulatory Visit: Payer: Self-pay

## 2018-06-30 DIAGNOSIS — Z1389 Encounter for screening for other disorder: Secondary | ICD-10-CM | POA: Diagnosis not present

## 2018-06-30 NOTE — Progress Notes (Signed)
  OBSTETRICS POSTPARTUM CLINIC PROGRESS NOTE  Subjective:     Beverly Daniels is a 38 y.o. [id number] female who presents for a postpartum visit. She is 6 weeks postpartum following a Term pregnancy and delivery by Vaginal, no problems at delivery.  I have fully reviewed the prenatal and intrapartum course. Anesthesia: none.  Postpartum course has been complicated by uncomplicated.  Baby is feeding by Breast.  Bleeding: patient has not  resumed menses.  Bowel function is normal. Bladder function is normal.  Patient is not sexually active. Contraception method desired is coitus interruptus and condoms. Postpartum depression screening: negative. [place] 2.  She has been on Zoloft for prevention as she had prior PPD.  The following portions of the patient's history were reviewed and updated as appropriate: allergies, current medications, past family history, past medical history, past social history, past surgical history and problem list.  Review of Systems Pertinent items are noted in HPI.  Objective:    BP 100/60   Ht 5\' 5"  (1.651 m)   Wt 145 lb (65.8 kg)   BMI 24.13 kg/m   General:  alert and no distress   Breasts:  inspection negative, no nipple discharge or bleeding, no masses or nodularity palpable  Lungs: clear to auscultation bilaterally  Heart:  regular rate and rhythm, S1, S2 normal, no murmur, click, rub or gallop  Abdomen: soft, non-tender; bowel sounds normal; no masses,  no organomegaly.     Vulva:  normal  Vagina: normal vagina, no discharge, exudate, lesion, or erythema  Cervix:  no cervical motion tenderness and no lesions  Corpus: normal size, contour, position, consistency, mobility, non-tender  Adnexa:  normal adnexa and no mass, fullness, tenderness  Rectal Exam: Not performed.          Assessment:  Post Partum Care visit 1. Postpartum care and examination  Plan:  See orders and Patient Instructions Contraceptive counseling for all methods, she  prefers no meds or IUD Resume all normal activities Follow up in: 4 months or as needed.   Annamarie Major, MD, [facility], [facility] 06/30/2018  2:09 PM

## 2018-10-13 ENCOUNTER — Other Ambulatory Visit: Payer: Self-pay

## 2018-10-13 DIAGNOSIS — F4321 Adjustment disorder with depressed mood: Secondary | ICD-10-CM

## 2018-10-13 MED ORDER — SERTRALINE HCL 100 MG PO TABS: 100.0000 mg | ORAL_TABLET | Freq: Every day | ORAL | 0 refills | Status: DC

## 2018-10-13 NOTE — Telephone Encounter (Signed)
advise

## 2019-01-09 ENCOUNTER — Other Ambulatory Visit: Payer: Self-pay

## 2019-01-09 DIAGNOSIS — F4321 Adjustment disorder with depressed mood: Secondary | ICD-10-CM

## 2019-01-09 NOTE — Telephone Encounter (Signed)
Patient called regarding refill of Zoloft. No Answer. Left message to call me. Dalia Heading, CNM

## 2019-01-18 ENCOUNTER — Other Ambulatory Visit: Payer: Self-pay

## 2019-01-18 DIAGNOSIS — F4321 Adjustment disorder with depressed mood: Secondary | ICD-10-CM

## 2019-01-18 MED ORDER — SERTRALINE HCL 100 MG PO TABS: 100.0000 mg | ORAL_TABLET | Freq: Every day | ORAL | 2 refills | Status: DC

## 2019-01-18 NOTE — Telephone Encounter (Signed)
advise

## 2019-01-18 NOTE — Telephone Encounter (Signed)
First time i've seen a refill request since September.  I'll approve it.

## 2019-01-18 NOTE — Telephone Encounter (Signed)
Pt calling; is having trouble getting refill of zoloft 100mg ; has tried twice thru [employer].  Is wondering if there is a holdup or something.  [phone]

## 2019-07-26 IMAGING — MR MR ABDOMEN W/O CM
11 series · 42 of 48 positions shown · non-contrast
Comparison: None.

CLINICAL DATA: Fifteen weeks pregnant. Known umbilical hernia which
has been painful for 2 months. Evaluate for incarceration.

EXAM:
MRI ABDOMEN WITHOUT CONTRAST
TECHNIQUE: Multiplanar multisequence MR imaging was performed without the
administration of intravenous contrast.

[Series 3: T2 · coronal · 6.0mm · 1.19mm/px · 3 of 30 slices shown (1 of 2)]
[im 1/30]
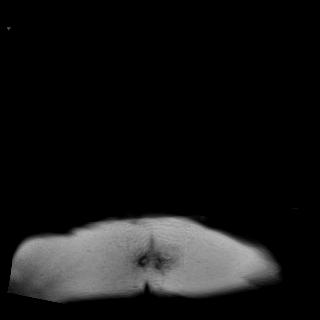
[im 15/30]
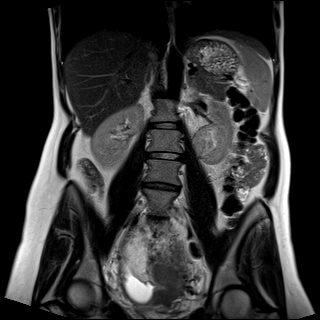
[im 30/30]
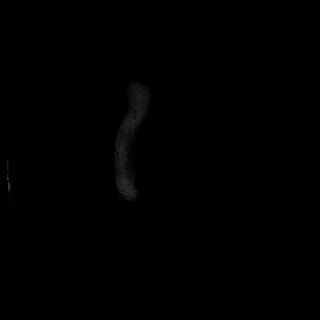

[Series 4: T2 · axial · 6.0mm · 1.19mm/px · z∈[-115,+94]mm · 4 of 30 slices shown (2 of 2)]
[im 1/30]
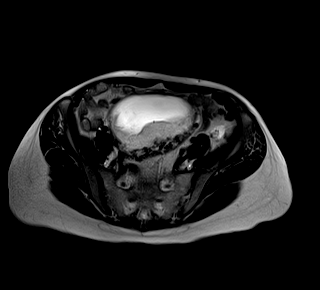
[im 10/30]
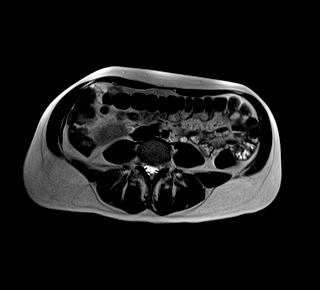
[im 20/30]
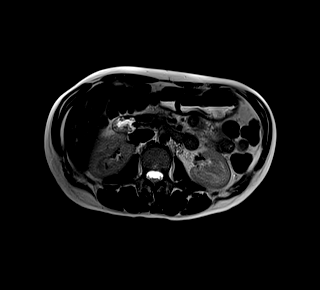
[im 30/30]
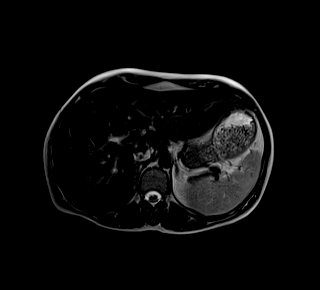

[Series 6: T1 · axial · 6.0mm · 0.74mm/px · z∈[-115,+94]mm · 4 of 30 slices shown (1 of 2)]
[im 1/30]
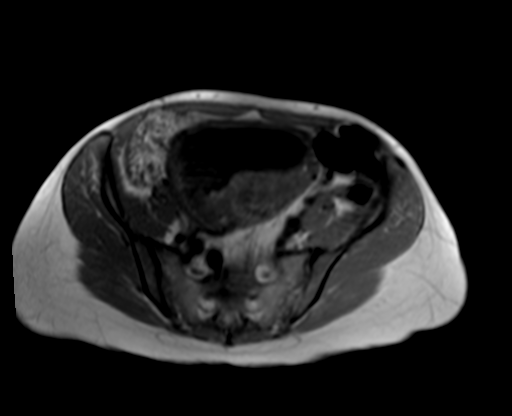
[im 10/30]
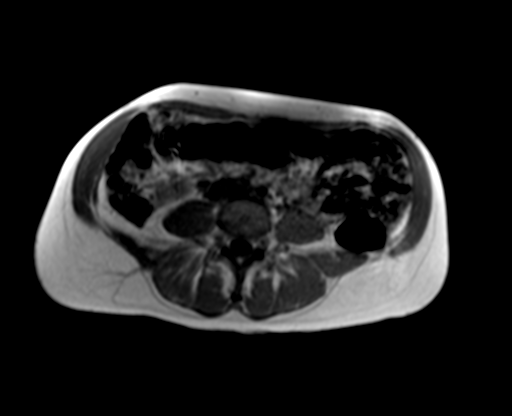
[im 20/30]
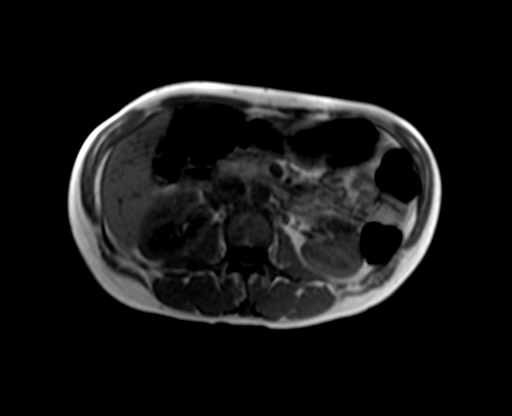
[im 30/30]
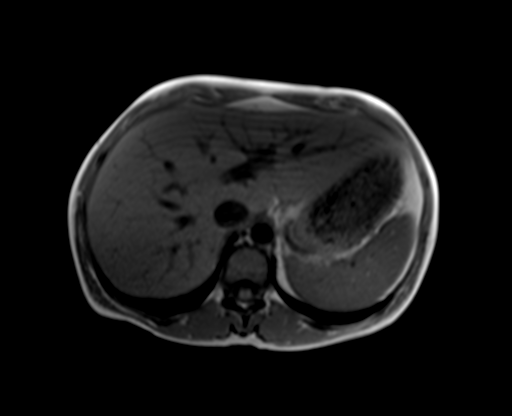

[Series 6: T1 · axial · 6.0mm · 0.74mm/px · z∈[-115,+94]mm · 4 of 30 slices shown (2 of 2)]
[im 1/30]
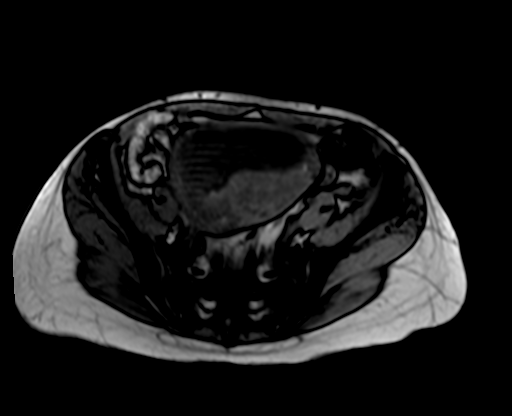
[im 10/30]
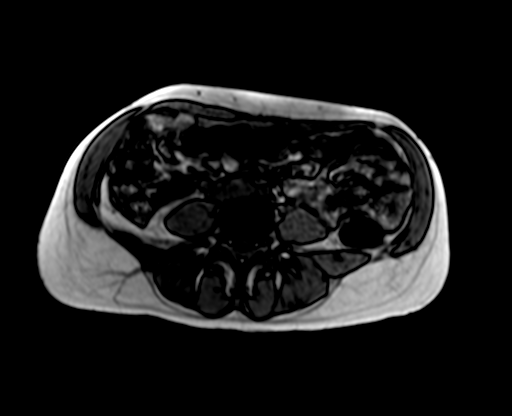
[im 20/30]
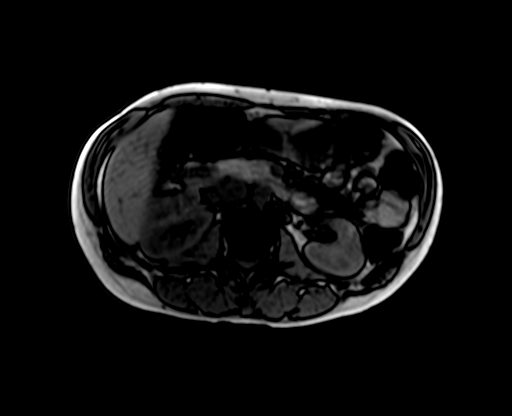
[im 30/30]
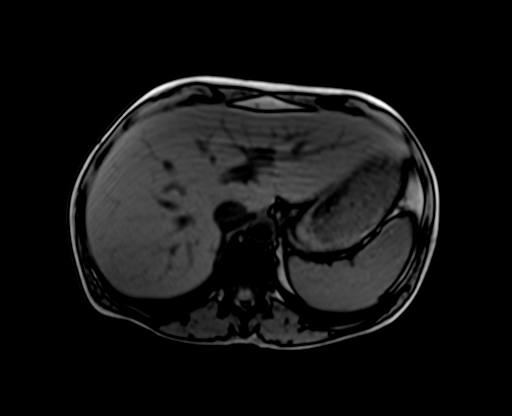

[Series 8: ax dwi_tracew · axial · 6.0mm · 1.42mm/px · z∈[-115,+94]mm · 4 of 30 slices shown (1 of 3)]
[im 1/30]
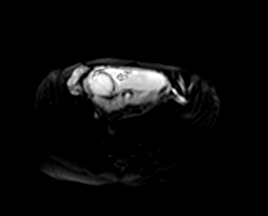
[im 10/30]
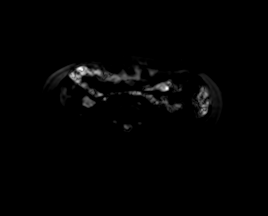
[im 20/30]
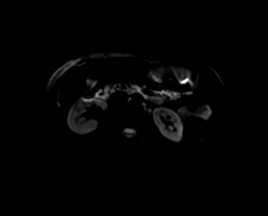
[im 30/30]
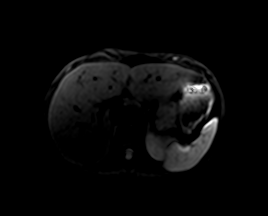

[Series 8: ax dwi_tracew · axial · 6.0mm · 1.42mm/px · z∈[-115,+94]mm · 4 of 30 slices shown (2 of 3)]
[im 1/30]
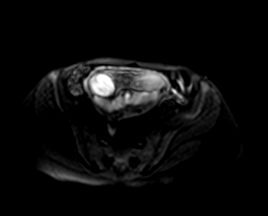
[im 10/30]
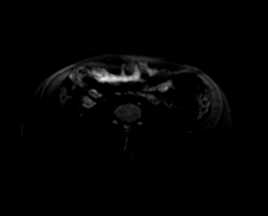
[im 20/30]
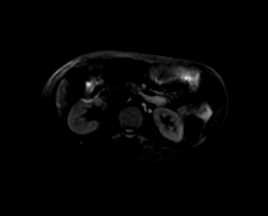
[im 30/30]
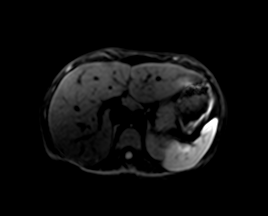

[Series 8: ax dwi_tracew · axial · 6.0mm · 1.42mm/px · z∈[-115,+94]mm · 4 of 30 slices shown (3 of 3)]
[im 1/30]
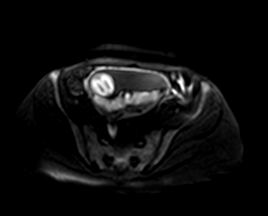
[im 10/30]
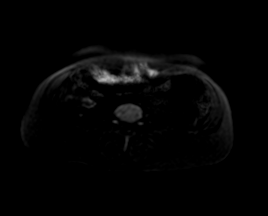
[im 20/30]
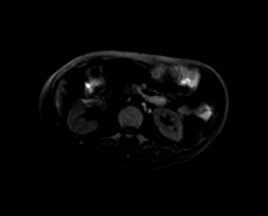
[im 30/30]
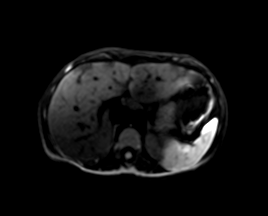

[Series 9: ax dwi_adc · axial · 6.0mm · 1.42mm/px · z∈[-115,+94]mm · 4 of 30 slices shown]
[im 1/30]
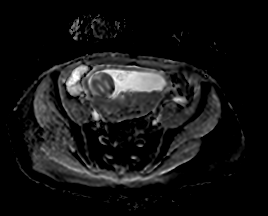
[im 10/30]
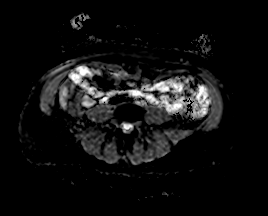
[im 20/30]
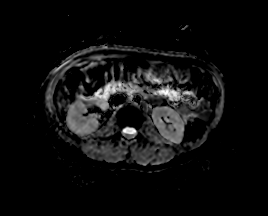
[im 30/30]
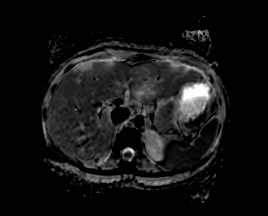

[Series 11: T2 fat-sat · axial · 6.0mm · 1.19mm/px · z∈[-115,+94]mm · 4 of 30 slices shown]
[im 1/30]
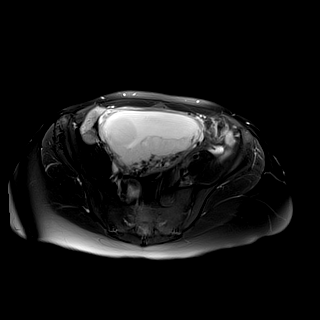
[im 10/30]
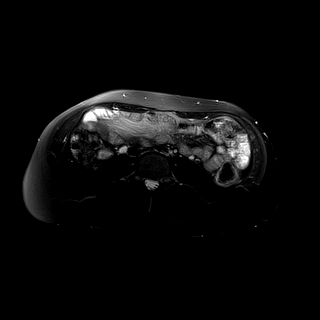
[im 20/30]
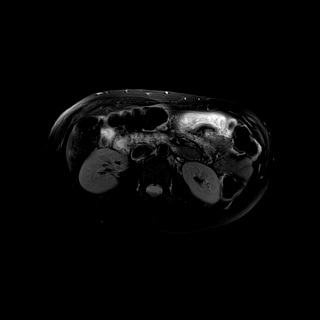
[im 30/30]
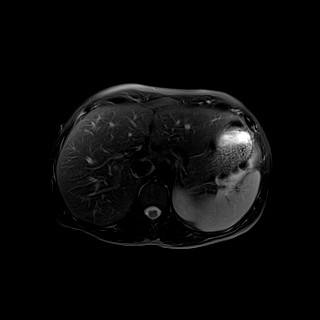

[Series 12: bSSFP · axial · 6.0mm · 0.74mm/px · z∈[-115,+94]mm · 4 of 30 slices shown]
[im 1/30]
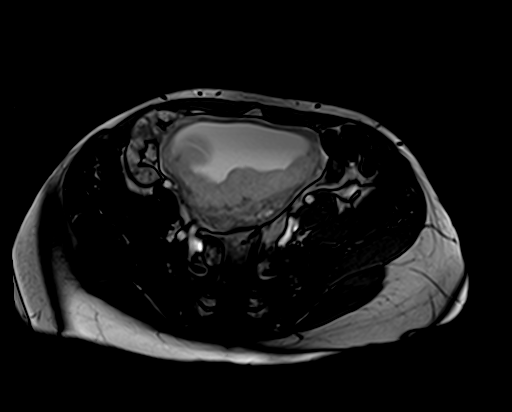
[im 10/30]
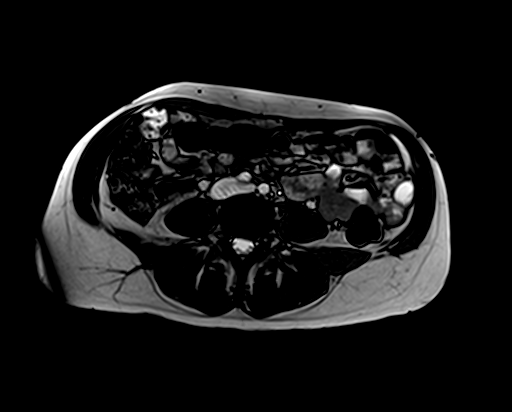
[im 20/30]
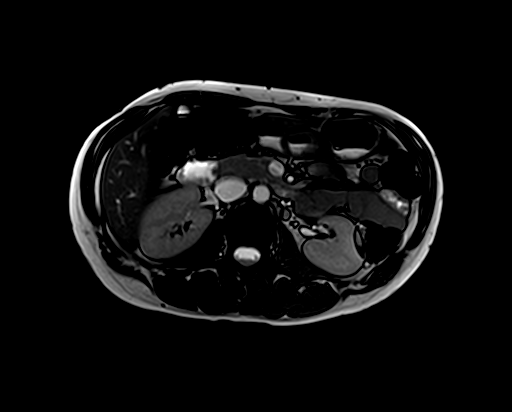
[im 30/30]
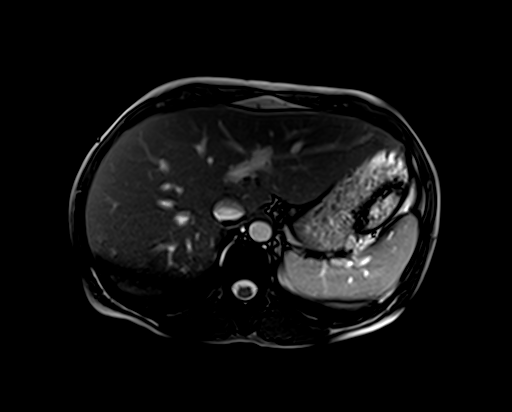

[Series 13: T1 dynamic fat-sat · axial · non-contrast · 3.0mm · 1.19mm/px · z∈[-117,-66]mm · 3 of 72 slices shown]
[im 1/72]
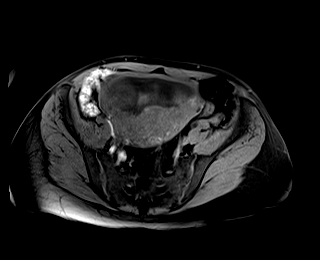
[im 9/72]
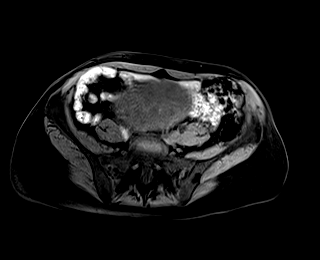
[im 18/72]
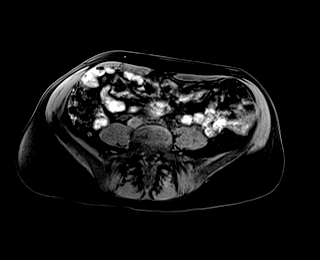

[42 of 48 positions shown; findings below may reference images not displayed]

FINDINGS: Lower chest: No acute findings.

Hepatobiliary: No focal liver abnormality. The gallbladder appears
normal. No gallstones noted. No biliary ductal dilatation.

Pancreas: No mass, inflammatory changes, or other parenchymal
abnormality identified.

Spleen:  Within normal limits in size and appearance.

Adrenals/Urinary Tract: The adrenal glands appear normal. No kidney
mass or hydronephrosis identified.

Stomach/Bowel: The stomach is nondistended. No abnormal small or
large bowel dilatation identified. The appendix is visualized and
appears normal.

Vascular/Lymphatic: Normal appearance of the abdominal aorta. No
aneurysm. No adenopathy.

Other: There is a periumbilical hernia identified which contains fat
only. This measures 2.5 by 1.2 by 2.8 cm. There is no surrounding
fat stranding or free fluid identified. Gravid uterus identified.

Musculoskeletal: No suspicious bone lesions identified.
IMPRESSION: 1. Periumbilical hernia is identified with a maximum dimension of
2.8 cm. Although limited due to lack of IV contrast material there
is no surrounding free fluid or fat stranding to suggest
incarceration. Clinical correlation are versus strongly recommended.
2. Gravid uterus.

## 2019-08-07 ENCOUNTER — Emergency Department
Admission: EM | Admit: 2019-08-07 | Discharge: 2019-08-07 | Disposition: A | Discharge status: Home / Self Care | Payer: Medicaid Other | Attending: Emergency Medicine | Admitting: Emergency Medicine

## 2019-08-07 ENCOUNTER — Other Ambulatory Visit: Payer: Self-pay

## 2019-08-07 ENCOUNTER — Encounter: Payer: Self-pay | Admitting: Emergency Medicine

## 2019-08-07 DIAGNOSIS — F419 Anxiety disorder, unspecified: Secondary | ICD-10-CM | POA: Diagnosis not present

## 2019-08-07 DIAGNOSIS — Z5321 Procedure and treatment not carried out due to patient leaving prior to being seen by health care provider: Secondary | ICD-10-CM | POA: Diagnosis not present

## 2019-08-07 DIAGNOSIS — F410 Panic disorder [episodic paroxysmal anxiety] without agoraphobia: Secondary | ICD-10-CM | POA: Insufficient documentation

## 2019-08-07 NOTE — ED Notes (Signed)
Pt called in the WR with no response, pt is not visualed in the WR or outside the front entrance, will attempt again in 5 min

## 2019-08-07 NOTE — ED Triage Notes (Signed)
C/O panic and anxiety since Friday.  Is taking Zoloft 100 mg daily.  Started after first child and post partum depression.  STates anxiety worsened on Friday.  Denies current SI/ HI.

## 2019-08-07 NOTE — ED Triage Notes (Signed)
Unable to find pt, have checked bathroom.

## 2019-08-08 ENCOUNTER — Telehealth: Payer: Self-pay | Admitting: Emergency Medicine

## 2019-08-08 NOTE — Telephone Encounter (Signed)
Called patient due to lwot to inquire about condition and follow up plans. Left message.   

## 2019-10-01 ENCOUNTER — Other Ambulatory Visit: Payer: Self-pay | Admitting: Obstetrics and Gynecology

## 2019-10-01 DIAGNOSIS — F4321 Adjustment disorder with depressed mood: Secondary | ICD-10-CM

## 2019-10-03 NOTE — Telephone Encounter (Signed)
Called and left voicemail for patient to call back to be scheduled. 

## 2019-10-03 NOTE — Telephone Encounter (Signed)
Advise

## 2019-10-03 NOTE — Telephone Encounter (Signed)
Needs a visit. It has been greater than 1 year since her last visit.

## 2019-10-05 MED ORDER — SERTRALINE HCL 100 MG PO TABS: 100.0000 mg | ORAL_TABLET | Freq: Every day | ORAL | 0 refills | Status: DC

## 2019-10-05 NOTE — Addendum Note (Signed)
Addended by: Nadara Mustard on: 10/05/2019 11:16 AM   Modules accepted: Orders

## 2019-10-05 NOTE — Telephone Encounter (Signed)
Patient is schedule for 10/24/19 with [facility]. Patient is requesting refill on her medication

## 2019-10-24 ENCOUNTER — Other Ambulatory Visit: Payer: Self-pay

## 2019-10-24 ENCOUNTER — Encounter: Payer: Self-pay | Admitting: Obstetrics & Gynecology

## 2019-10-24 ENCOUNTER — Other Ambulatory Visit (HOSPITAL_COMMUNITY)
Admission: RE | Admit: 2019-10-24 | Discharge: 2019-10-24 | Disposition: A | Discharge status: Home / Self Care | Payer: Medicaid Other | Source: Ambulatory Visit | Attending: Obstetrics & Gynecology | Admitting: Obstetrics & Gynecology

## 2019-10-24 ENCOUNTER — Ambulatory Visit (INDEPENDENT_AMBULATORY_CARE_PROVIDER_SITE_OTHER): Payer: Medicaid Other | Admitting: Obstetrics & Gynecology

## 2019-10-24 VITALS — BP 100/60 | Ht 67.0 in | Wt 132.0 lb

## 2019-10-24 DIAGNOSIS — Z01419 Encounter for gynecological examination (general) (routine) without abnormal findings: Secondary | ICD-10-CM | POA: Diagnosis not present

## 2019-10-24 DIAGNOSIS — Z124 Encounter for screening for malignant neoplasm of cervix: Secondary | ICD-10-CM | POA: Diagnosis present

## 2019-10-24 NOTE — Progress Notes (Signed)
HPI:      [name] is a 39 y.o. [id number] who LMP was Patient's last menstrual period was 10/06/2019., she presents today for her annual examination. The patient has no complaints today. The patient is sexually active. Her last pap: was normal. The patient does perform self breast exams.  There is no notable family history of breast or ovarian cancer in her family.  The patient has regular exercise: yes.  The patient denies current symptoms of depression.    GYN History: Contraception: condoms  PMHx: Past Medical History:  Diagnosis Date  . Anxiety   . Depression   . Ectopic pregnancy    Past Surgical History:  Procedure Laterality Date  . LAPAROSCOPIC OVARIAN CYSTECTOMY Left 07/09/2011  . salpingectomy Left    History reviewed. No pertinent family history. Social History   Tobacco Use  . Smoking status: Never Smoker  . Smokeless tobacco: Never Used  Vaping Use  . Vaping Use: Never used  Substance Use Topics  . Alcohol use: No  . Drug use: No    Current Outpatient Medications:  .  sertraline (ZOLOFT) 100 MG tablet, Take 1 tablet (100 mg total) by mouth daily., Disp: 90 tablet, Rfl: 0 .  acetaminophen (TYLENOL) 325 MG tablet, Take 2 tablets (650 mg total) by mouth every 4 (four) hours as needed (for pain scale < 4). (Patient not taking: Reported on 10/24/2019), Disp: 30 tablet, Rfl: 3 .  clonazePAM (KLONOPIN) 0.5 MG tablet, Take 1 tablet (0.5 mg total) by mouth 2 (two) times daily as needed for anxiety. (Patient not taking: Reported on 10/24/2019), Disp: 30 tablet, Rfl: 1 .  Prenatal Vit-Fe Fumarate-FA (MULTIVITAMIN-PRENATAL) 27-0.8 MG TABS tablet, Take 1 tablet by mouth daily at 12 noon. (Patient not taking: Reported on 10/24/2019), Disp: , Rfl:  Allergies: Amoxicillin and Cortisone  Review of Systems  Constitutional: Negative for chills, fever and malaise/fatigue.  HENT: Negative for congestion, sinus pain and sore throat.   Eyes: Negative for blurred vision and  pain.  Respiratory: Negative for cough and wheezing.   Cardiovascular: Negative for chest pain and leg swelling.  Gastrointestinal: Negative for abdominal pain, constipation, diarrhea, heartburn, nausea and vomiting.  Genitourinary: Negative for dysuria, frequency, hematuria and urgency.  Musculoskeletal: Negative for back pain, joint pain, myalgias and neck pain.  Skin: Negative for itching and rash.  Neurological: Negative for dizziness, tremors and weakness.  Endo/Heme/Allergies: Does not bruise/bleed easily.  Psychiatric/Behavioral: Negative for depression. The patient is not nervous/anxious and does not have insomnia.   All other systems reviewed and are negative.   Objective: BP 100/60   Ht 5\' 7"  (1.702 m)   Wt 132 lb (59.9 kg)   LMP 10/06/2019   BMI 20.67 kg/m   Filed Weights   10/24/19 0919  Weight: 132 lb (59.9 kg)   Body mass index is 20.67 kg/m. Physical Exam Constitutional:      General: She is not in acute distress.    Appearance: She is well-developed.  Genitourinary:     Pelvic exam was performed with patient supine.     Vagina, uterus and rectum normal.     No lesions in the vagina.     No vaginal bleeding.     No cervical motion tenderness, friability, lesion or polyp.     Uterus is mobile.     Uterus is not enlarged.     No uterine mass detected.    Uterus is midaxial.     No right or  left adnexal mass present.     Right adnexa not tender.     Left adnexa not tender.  HENT:     Head: Normocephalic and atraumatic. No laceration.     Right Ear: Hearing normal.     Left Ear: Hearing normal.     Mouth/Throat:     Pharynx: Uvula midline.  Eyes:     Pupils: Pupils are equal, round, and reactive to light.  Neck:     Thyroid: No thyromegaly.  Cardiovascular:     Rate and Rhythm: Normal rate and regular rhythm.     Heart sounds: No murmur heard.  No friction rub. No gallop.   Pulmonary:     Effort: Pulmonary effort is normal. No respiratory distress.       Breath sounds: Normal breath sounds. No wheezing.  Chest:     Breasts:        Right: No mass, skin change or tenderness.        Left: No mass, skin change or tenderness.  Abdominal:     General: Bowel sounds are normal. There is no distension.     Palpations: Abdomen is soft.     Tenderness: There is no abdominal tenderness. There is no rebound.  Musculoskeletal:        General: Normal range of motion.     Cervical back: Normal range of motion and neck supple.  Neurological:     Mental Status: She is alert and oriented to person, place, and time.     Cranial Nerves: No cranial nerve deficit.  Skin:    General: Skin is warm and dry.  Psychiatric:        Judgment: Judgment normal.  Vitals reviewed.     Assessment:  ANNUAL EXAM 1. Women's annual routine gynecological examination   2. Screening for cervical cancer      Screening Plan:            1.  Cervical Screening-  Pap smear done today  2. Breast screening- Exam annually and mammogram>40 planned next year  3. Colonoscopy every 10 years, Hemoccult testing - after age 85  4. Labs To return fasting at a later date or when has coverage (ins for gyn only today)  5. Counseling for contraception: condoms  The pregnancy intention screening data noted above was reviewed. Potential methods of contraception were discussed. The patient elected to proceed with Female Condom and Withdrawal or Other Method.       F/U  Return in about 1 year (around 10/23/2020) for Annual.  Annamarie Major, MD, [facility], [facility] 10/24/2019  9:55 AM

## 2019-10-24 NOTE — Patient Instructions (Signed)

## 2019-10-26 LAB — CYTOLOGY - PAP
Comment: NEGATIVE
Diagnosis: NEGATIVE
High risk HPV: NEGATIVE

## 2020-01-11 ENCOUNTER — Other Ambulatory Visit: Payer: Self-pay | Admitting: Obstetrics & Gynecology

## 2020-01-11 DIAGNOSIS — F4321 Adjustment disorder with depressed mood: Secondary | ICD-10-CM

## 2020-02-01 ENCOUNTER — Encounter: Payer: Self-pay | Admitting: Family Medicine

## 2020-02-01 ENCOUNTER — Ambulatory Visit (INDEPENDENT_AMBULATORY_CARE_PROVIDER_SITE_OTHER): Payer: Medicaid Other | Admitting: Family Medicine

## 2020-02-01 ENCOUNTER — Other Ambulatory Visit: Payer: Self-pay

## 2020-02-01 VITALS — BP 104/63 | HR 63 | Temp 98.5°F | Ht 64.0 in | Wt 133.5 lb

## 2020-02-01 DIAGNOSIS — R0982 Postnasal drip: Secondary | ICD-10-CM | POA: Diagnosis not present

## 2020-02-01 DIAGNOSIS — N9089 Other specified noninflammatory disorders of vulva and perineum: Secondary | ICD-10-CM

## 2020-02-01 DIAGNOSIS — J3089 Other allergic rhinitis: Secondary | ICD-10-CM

## 2020-02-01 DIAGNOSIS — H8110 Benign paroxysmal vertigo, unspecified ear: Secondary | ICD-10-CM

## 2020-02-01 MED ORDER — FLUTICASONE PROPIONATE 50 MCG/ACT NA SUSP: 2.0000 | Freq: Every day | NASAL | 6 refills | Status: DC

## 2020-02-01 MED ORDER — MECLIZINE HCL 12.5 MG PO TABS: 12.5000 mg | ORAL_TABLET | Freq: Three times a day (TID) | ORAL | 0 refills | Status: DC | PRN

## 2020-02-01 NOTE — Progress Notes (Signed)
New patient visit   Patient: Beverly Daniels   DOB: [birth year]   40 y.o. Female  MRN: [id number] Visit Date: 02/01/2020  Today's healthcare provider: Shirlee Latch, MD   Chief Complaint  Patient presents with  . Establish Care   Subjective    Beverly Daniels is a 40 y.o. female who presents today as a new patient to establish care.  HPI  Pt would also like to talk about c/o of dizziness. +sneezing. Started yesterday. Feels like she has some congestion for several months.    No previous PCP.  Depression and anxiety - postpartum. She is 20 months postpartum.  Takes Zoloft 100mg  daily with good control of symptoms. Take Klonopin postpartum for a month or two.  Still breastfeeding  Currently on menstrual cycle. Has vulvar itching during menses, intermittently. No h/o STDs. No vaginal discharge. Sexually active. No contraception.   Past Medical History:  Diagnosis Date  . Anxiety    postpartum   . Clotting disorder (HCC)    History of ITP  . Depression    postpartum  . Ectopic pregnancy   . History of ITP    Age 82 but is no longer present according to patient   Past Surgical History:  Procedure Laterality Date  . LAPAROSCOPIC OVARIAN CYSTECTOMY Left 07/09/2011  . salpingectomy Left    Family Status  Relation Name Status  . Mother  (Not Specified)  . MGM  Deceased  . PGM  (Not Specified)  . Neg Hx  (Not Specified)   Family History  Problem Relation Age of Onset  . COPD Mother        non smoker  . Atrial fibrillation Mother   . Congestive Heart Failure Maternal Grandmother   . Stroke Maternal Grandmother   . Skin cancer Maternal Grandmother   . Anxiety disorder Paternal Grandmother   . Depression Paternal Grandmother   . Breast cancer Neg Hx   . Colon cancer Neg Hx    Social History   Socioeconomic History  . Marital status: Married    Spouse name: Not on file  . Number of children: 4  . Years of education: Not on file  . Highest education  level: Not on file  Occupational History  . Occupation: stay at home mom  Tobacco Use  . Smoking status: Never Smoker  . Smokeless tobacco: Never Used  Vaping Use  . Vaping Use: Never used  Substance and Sexual Activity  . Alcohol use: No    Comment: rarely  . Drug use: No  . Sexual activity: Yes    Partners: Male    Birth control/protection: None  Other Topics Concern  . Not on file  Social History Narrative  . Not on file   Social Determinants of Health   Financial Resource Strain: Not on file  Food Insecurity: Not on file  Transportation Needs: Not on file  Physical Activity: Not on file  Stress: Not on file  Social Connections: Not on file   Outpatient Medications Prior to Visit  Medication Sig  . Collagen-Vitamin C-Biotin 500-50-0.8 MG CAPS Take 1 tablet by mouth daily.  . Multiple Vitamin (MULTIVITAMIN) tablet Take 1 tablet by mouth daily.  . sertraline (ZOLOFT) 100 MG tablet TAKE 1 TABLET(100 MG) BY MOUTH DAILY  . [DISCONTINUED] acetaminophen (TYLENOL) 325 MG tablet Take 2 tablets (650 mg total) by mouth every 4 (four) hours as needed (for pain scale < 4). (Patient not taking: No sig reported)  . [DISCONTINUED]  clonazePAM (KLONOPIN) 0.5 MG tablet Take 1 tablet (0.5 mg total) by mouth 2 (two) times daily as needed for anxiety. (Patient not taking: No sig reported)  . [DISCONTINUED] Prenatal Vit-Fe Fumarate-FA (MULTIVITAMIN-PRENATAL) 27-0.8 MG TABS tablet Take 1 tablet by mouth daily at 12 noon. (Patient not taking: No sig reported)   No facility-administered medications prior to visit.   Allergies  Allergen Reactions  . Amoxicillin Swelling, Rash and Itching    Swelling on face; itchy all over; rash all over as well  . Cortisone Palpitations and Other (See Comments)    Patient passed out after cortisone shot.     Immunization History  Administered Date(s) Administered  . Tdap 06/25/2009    Health Maintenance  Topic Date Due  . Hepatitis C Screening  Never  done  . COVID-19 Vaccine (1) Never done  . TETANUS/TDAP  06/26/2019  . INFLUENZA VACCINE  04/25/2020 (Originally 08/27/2019)  . PAP SMEAR-Modifier  10/24/2022  . HIV Screening  Completed    Patient Care Team: Patient, No Pcp Per as PCP - General (General Practice)  Review of Systems  Constitutional: Negative for chills and fever.  HENT: Positive for congestion, postnasal drip and sneezing.   Respiratory: Negative for cough, shortness of breath and wheezing.        Congestion  Neurological: Positive for dizziness.  All other systems reviewed and are negative.     Objective    BP 104/63 (BP Location: Left Arm, Patient Position: Sitting, Cuff Size: Large)   Pulse 63   Temp 98.5 F (36.9 C) (Oral)   Ht 5\' 4"  (1.626 m)   Wt 133 lb 8 oz (60.6 kg)   BMI 22.92 kg/m  Physical Exam Vitals reviewed.  Constitutional:      General: She is not in acute distress.    Appearance: Normal appearance. She is well-developed. She is not diaphoretic.  HENT:     Head: Normocephalic and atraumatic.     Right Ear: Tympanic membrane, ear canal and external ear normal.     Left Ear: Tympanic membrane, ear canal and external ear normal.     Nose: Nose normal.     Mouth/Throat:     Mouth: Mucous membranes are moist.     Pharynx: Oropharynx is clear. No oropharyngeal exudate.  Eyes:     General: No scleral icterus.    Conjunctiva/sclera: Conjunctivae normal.     Pupils: Pupils are equal, round, and reactive to light.  Neck:     Thyroid: No thyromegaly.  Cardiovascular:     Rate and Rhythm: Normal rate and regular rhythm.     Pulses: Normal pulses.     Heart sounds: Normal heart sounds. No murmur heard.   Pulmonary:     Effort: Pulmonary effort is normal. No respiratory distress.     Breath sounds: Normal breath sounds. No wheezing or rales.  Abdominal:     General: There is no distension.     Palpations: Abdomen is soft.     Tenderness: There is no abdominal tenderness.   Genitourinary:    Comments: Normal external genitalia Musculoskeletal:        General: No deformity.     Cervical back: Neck supple.     Right lower leg: No edema.     Left lower leg: No edema.  Lymphadenopathy:     Cervical: No cervical adenopathy.  Skin:    General: Skin is warm and dry.     Findings: No rash.  Neurological:  Mental Status: She is alert and oriented to person, place, and time. Mental status is at baseline.     Sensory: No sensory deficit.     Motor: No weakness.     Gait: Gait normal.  Psychiatric:        Mood and Affect: Mood normal.        Behavior: Behavior normal.        Thought Content: Thought content normal.     Depression Screen PHQ 2/9 Scores 02/01/2020  PHQ - 2 Score 0  PHQ- 9 Score 0   No results found for any visits on 02/01/20.  Assessment & Plan      1. Benign paroxysmal positional vertigo, unspecified laterality - new problem - benign exam, no hearing loss - likely related to allergic rhinitis ad congestion - treatment as below - meclizine prn - discussed return precautions  2. Postnasal drip 3. Seasonal allergic rhinitis due to other allergic trigger - longstanding issue in the fall and winter - likely related to dust or ragweed - discussed that antihistamine may affect milk supply - trial of flonase  4. Vulvar irritation - discussed changing type of pads as this could be contributing - can use OTC hydrocortisone prn externally   Return in about 6 months (around 07/31/2020) for CPE.      Total time spent on today's visit was greater than 30 minutes, including both face-to-face time and nonface-to-face time personally spent on review of chart (labs and imaging), discussing labs and goals, discussing further work-up, treatment options, referrals to specialist if needed, reviewing outside records of pertinent, answering patient's questions, and coordinating care.    I, Lavon Paganini, MD, have reviewed all documentation for  this visit. The documentation on 02/01/20 for the exam, diagnosis, procedures, and orders are all accurate and complete.   Bacigalupo, Dionne Bucy, MD, MPH [facility]

## 2020-02-01 NOTE — Patient Instructions (Signed)

## 2020-07-26 ENCOUNTER — Other Ambulatory Visit: Payer: Self-pay

## 2020-07-26 ENCOUNTER — Ambulatory Visit (INDEPENDENT_AMBULATORY_CARE_PROVIDER_SITE_OTHER): Payer: Medicaid Other | Admitting: Family Medicine

## 2020-07-26 ENCOUNTER — Encounter: Payer: Self-pay | Admitting: Family Medicine

## 2020-07-26 VITALS — BP 105/70 | HR 70 | Temp 98.2°F | Resp 18 | Ht 64.5 in | Wt 132.0 lb

## 2020-07-26 DIAGNOSIS — Z0000 Encounter for general adult medical examination without abnormal findings: Secondary | ICD-10-CM

## 2020-07-26 DIAGNOSIS — F4321 Adjustment disorder with depressed mood: Secondary | ICD-10-CM

## 2020-07-26 DIAGNOSIS — F411 Generalized anxiety disorder: Secondary | ICD-10-CM | POA: Diagnosis not present

## 2020-07-26 DIAGNOSIS — Z1231 Encounter for screening mammogram for malignant neoplasm of breast: Secondary | ICD-10-CM

## 2020-07-26 DIAGNOSIS — Z1159 Encounter for screening for other viral diseases: Secondary | ICD-10-CM | POA: Diagnosis not present

## 2020-07-26 NOTE — Patient Instructions (Signed)
Preventive Care 21-39 Years Old, Female Preventive care refers to lifestyle choices and visits with your health care provider that can promote health and wellness. This includes: A yearly physical exam. This is also called an annual wellness visit. Regular dental and eye exams. Immunizations. Screening for certain conditions. Healthy lifestyle choices, such as: Eating a healthy diet. Getting regular exercise. Not using drugs or products that contain nicotine and tobacco. Limiting alcohol use. What can I expect for my preventive care visit? Physical exam Your health care provider may check your: Height and weight. These may be used to calculate your BMI (body mass index). BMI is a measurement that tells if you are at a healthy weight. Heart rate and blood pressure. Body temperature. Skin for abnormal spots. Counseling Your health care provider may ask you questions about your: Past medical problems. Family's medical history. Alcohol, tobacco, and drug use. Emotional well-being. Home life and relationship well-being. Sexual activity. Diet, exercise, and sleep habits. Work and work environment. Access to firearms. Method of birth control. Menstrual cycle. Pregnancy history. What immunizations do I need?  Vaccines are usually given at various ages, according to a schedule. Your health care provider will recommend vaccines for you based on your age, medicalhistory, and lifestyle or other factors, such as travel or where you work. What tests do I need?  Blood tests Lipid and cholesterol levels. These may be checked every 5 years starting at age 20. Hepatitis C test. Hepatitis B test. Screening Diabetes screening. This is done by checking your blood sugar (glucose) after you have not eaten for a while (fasting). STD (sexually transmitted disease) testing, if you are at risk. BRCA-related cancer screening. This may be done if you have a family history of breast, ovarian, tubal, or  peritoneal cancers. Pelvic exam and Pap test. This may be done every 3 years starting at age 21. Starting at age 30, this may be done every 5 years if you have a Pap test in combination with an HPV test. Talk with your health care provider about your test results, treatment options,and if necessary, the need for more tests. Follow these instructions at home: Eating and drinking  Eat a healthy diet that includes fresh fruits and vegetables, whole grains, lean protein, and low-fat dairy products. Take vitamin and mineral supplements as recommended by your health care provider. Do not drink alcohol if: Your health care provider tells you not to drink. You are pregnant, may be pregnant, or are planning to become pregnant. If you drink alcohol: Limit how much you have to 0-1 drink a day. Be aware of how much alcohol is in your drink. In the [place] one drink equals one 12 oz bottle of beer (355 mL), one 5 oz glass of wine (148 mL), or one 1 oz glass of hard liquor (44 mL).  Lifestyle Take daily care of your teeth and gums. Brush your teeth every morning and night with fluoride toothpaste. Floss one time each day. Stay active. Exercise for at least 30 minutes 5 or more days each week. Do not use any products that contain nicotine or tobacco, such as cigarettes, e-cigarettes, and chewing tobacco. If you need help quitting, ask your health care provider. Do not use drugs. If you are sexually active, practice safe sex. Use a condom or other form of protection to prevent STIs (sexually transmitted infections). If you do not wish to become pregnant, use a form of birth control. If you plan to become pregnant, see your health care   provider for a prepregnancy visit. Find healthy ways to cope with stress, such as: Meditation, yoga, or listening to music. Journaling. Talking to a trusted person. Spending time with friends and family. Safety Always wear your seat belt while driving or riding in a  vehicle. Do not drive: If you have been drinking alcohol. Do not ride with someone who has been drinking. When you are tired or distracted. While texting. Wear a helmet and other protective equipment during sports activities. If you have firearms in your house, make sure you follow all gun safety procedures. Seek help if you have been physically or sexually abused. What's next? Go to your health care provider once a year for an annual wellness visit. Ask your health care provider how often you should have your eyes and teeth checked. Stay up to date on all vaccines. This information is not intended to replace advice given to you by your health care provider. Make sure you discuss any questions you have with your healthcare provider. Document Revised: 09/10/2019 Document Reviewed: 09/23/2017 Elsevier Patient Education  2022 [employer].

## 2020-07-26 NOTE — Progress Notes (Signed)
Complete physical exam   Patient: Beverly Daniels   DOB: 04-Jun-1980   40 y.o. Female  MRN: [id number] Visit Date: 07/26/2020  Today's healthcare provider: Shirlee Latch, MD   Chief Complaint  Patient presents with   Annual Exam   Anxiety    Subjective    Beverly Daniels is a 40 y.o. female who presents today for a complete physical exam.  She reports consuming a general diet. Home exercise routine includes running. She generally feels fairly well. She reports sleeping fairly well. She does have additional problems to discuss today.  Anxiety Patient reports no chest pain, confusion, dizziness, nausea, nervous/anxious behavior or shortness of breath.     Anxiety: Patient is requesting a referral to see a therapist.  Palpitations She states that she experiences palpitations but it is not frequent and when it does occur it only lasts a few seconds. She states that she experienced more severe palpitations when she was pregnant.  -----------------------------------------------------------------------------------------   Past Medical History:  Diagnosis Date   Anxiety    postpartum    Clotting disorder (HCC)    History of ITP   Depression    postpartum   Ectopic pregnancy    History of ITP    Age 37 but is no longer present according to patient   Past Surgical History:  Procedure Laterality Date   LAPAROSCOPIC OVARIAN CYSTECTOMY Left 07/09/2011   salpingectomy Left    Social History   Socioeconomic History   Marital status: Married    Spouse name: Not on file   Number of children: 4   Years of education: Not on file   Highest education level: Not on file  Occupational History   Occupation: stay at home mom  Tobacco Use   Smoking status: Never   Smokeless tobacco: Never  Vaping Use   Vaping Use: Never used  Substance and Sexual Activity   Alcohol use: No    Comment: rarely   Drug use: No   Sexual activity: Yes    Partners: Male    Birth  control/protection: None  Other Topics Concern   Not on file  Social History Narrative   Not on file   Social Determinants of Health   Financial Resource Strain: Not on file  Food Insecurity: Not on file  Transportation Needs: Not on file  Physical Activity: Not on file  Stress: Not on file  Social Connections: Not on file  Intimate Partner Violence: Not on file   Family Status  Relation Name Status   Mother  (Not Specified)   MGM  Deceased   PGM  (Not Specified)   Neg Hx  (Not Specified)   Family History  Problem Relation Age of Onset   COPD Mother        non smoker   Atrial fibrillation Mother    Congestive Heart Failure Maternal Grandmother    Stroke Maternal Grandmother    Skin cancer Maternal Grandmother    Anxiety disorder Paternal Grandmother    Depression Paternal Grandmother    Breast cancer Neg Hx    Colon cancer Neg Hx    Allergies  Allergen Reactions   Amoxicillin Swelling, Rash and Itching    Swelling on face; itchy all over; rash all over as well   Cortisone Palpitations and Other (See Comments)    Patient passed out after cortisone shot.     Patient Care Team: Erasmo Downer, MD as PCP - General (Family Medicine)  Medications: Outpatient Medications Prior to Visit  Medication Sig   Collagen-Vitamin C-Biotin 500-50-0.8 MG CAPS Take 1 tablet by mouth daily.   Multiple Vitamin (MULTIVITAMIN) tablet Take 1 tablet by mouth daily.   sertraline (ZOLOFT) 100 MG tablet TAKE 1 TABLET(100 MG) BY MOUTH DAILY   fluticasone (FLONASE) 50 MCG/ACT nasal spray Place 2 sprays into both nostrils daily. (Patient not taking: Reported on 07/26/2020)   [DISCONTINUED] meclizine (ANTIVERT) 12.5 MG tablet Take 1 tablet (12.5 mg total) by mouth 3 (three) times daily as needed for dizziness. (Patient not taking: Reported on 07/26/2020)   No facility-administered medications prior to visit.    Review of Systems  Constitutional:  Negative for chills, fatigue and fever.   HENT:  Negative for congestion, ear pain, rhinorrhea, sinus pain, sneezing and sore throat.   Eyes: Negative.  Negative for pain and redness.  Respiratory:  Negative for cough, shortness of breath and wheezing.   Cardiovascular:  Negative for chest pain and leg swelling.  Gastrointestinal:  Negative for abdominal pain, blood in stool, constipation, diarrhea, nausea and vomiting.  Endocrine: Negative for polydipsia and polyphagia.  Genitourinary: Negative.  Negative for dysuria, flank pain, frequency, hematuria, pelvic pain, urgency, vaginal bleeding and vaginal discharge.  Musculoskeletal:  Negative for arthralgias, back pain, gait problem and joint swelling.  Skin:  Negative for rash.  Neurological: Negative.  Negative for dizziness, tremors, seizures, weakness, light-headedness, numbness and headaches.  Hematological:  Negative for adenopathy.  Psychiatric/Behavioral: Negative.  Negative for behavioral problems, confusion and dysphoric mood. The patient is not nervous/anxious and is not hyperactive.      Objective    BP 105/70 (BP Location: Right Arm, Patient Position: Sitting, Cuff Size: Normal)   Pulse 70   Temp 98.2 F (36.8 C) (Oral)   Resp 18   Ht 5' 4.5" (1.638 m)   Wt 132 lb (59.9 kg)   SpO2 99% Comment: room air  BMI 22.31 kg/m    Physical Exam Vitals reviewed.  Constitutional:      General: She is not in acute distress.    Appearance: Normal appearance. She is well-developed. She is not diaphoretic.  HENT:     Head: Normocephalic and atraumatic.     Right Ear: Tympanic membrane, ear canal and external ear normal.     Left Ear: Tympanic membrane, ear canal and external ear normal.     Nose: Nose normal.     Mouth/Throat:     Mouth: Mucous membranes are moist.     Pharynx: Oropharynx is clear. No oropharyngeal exudate.  Eyes:     General: No scleral icterus.    Conjunctiva/sclera: Conjunctivae normal.     Pupils: Pupils are equal, round, and reactive to light.   Neck:     Thyroid: No thyromegaly.  Cardiovascular:     Rate and Rhythm: Normal rate and regular rhythm.     Pulses: Normal pulses.     Heart sounds: Normal heart sounds. No murmur heard. Pulmonary:     Effort: Pulmonary effort is normal. No respiratory distress.     Breath sounds: Normal breath sounds. No wheezing or rales.  Abdominal:     General: There is no distension.     Palpations: Abdomen is soft.     Tenderness: There is no abdominal tenderness.  Musculoskeletal:        General: No deformity.     Cervical back: Neck supple.     Right lower leg: No edema.     Left lower leg:  No edema.  Lymphadenopathy:     Cervical: No cervical adenopathy.  Skin:    General: Skin is warm and dry.     Findings: No rash.  Neurological:     Mental Status: She is alert and oriented to person, place, and time. Mental status is at baseline.     Sensory: No sensory deficit.     Motor: No weakness.     Gait: Gait normal.  Psychiatric:        Mood and Affect: Mood normal.        Behavior: Behavior normal.        Thought Content: Thought content normal.      Last depression screening scores PHQ 2/9 Scores 07/26/2020 02/01/2020  PHQ - 2 Score 0 0  PHQ- 9 Score 1 0   Last fall risk screening Fall Risk  07/26/2020  Falls in the past year? 0  Number falls in past yr: 0  Injury with Fall? 0  Follow up Falls evaluation completed   Last Audit-C alcohol use screening Alcohol Use Disorder Test (AUDIT) 02/01/2020  1. How often do you have a drink containing alcohol? 2  2. How many drinks containing alcohol do you have on a typical day when you are drinking? 0  3. How often do you have six or more drinks on one occasion? 0  AUDIT-C Score 2  Alcohol Brief Interventions/Follow-up AUDIT Score <7 follow-up not indicated   A score of 3 or more in women, and 4 or more in men indicates increased risk for alcohol abuse, EXCEPT if all of the points are from question 1   No results found for any visits on  07/26/20.  Assessment & Plan    Routine Health Maintenance and Physical Exam  Exercise Activities and Dietary recommendations  Goals   None     Immunization History  Administered Date(s) Administered   Tdap 06/25/2009    Health Maintenance  Topic Date Due   COVID-19 Vaccine (1) Never done   Hepatitis C Screening  Never done   TETANUS/TDAP  06/26/2019   INFLUENZA VACCINE  08/26/2020   PAP SMEAR-Modifier  10/24/2022   HIV Screening  Completed   Pneumococcal Vaccine 36-39 Years old  Aged Out   HPV VACCINES  Aged Out    Discussed health benefits of physical activity, and encouraged her to engage in regular exercise appropriate for her age and condition.  Problem List Items Addressed This Visit       Other   Adjustment disorder with depressed mood   Relevant Orders   Ambulatory referral to Psychology   Other Visit Diagnoses     Encounter for annual physical exam    -  Primary   Relevant Orders   Hepatitis C Antibody   CBC w/Diff/Platelet   Comprehensive metabolic panel   Lipid panel   GAD (generalized anxiety disorder)       Relevant Orders   Ambulatory referral to Psychology   Need for hepatitis C screening test       Relevant Orders   Hepatitis C Antibody   Breast cancer screening by mammogram       Relevant Orders   MM 3D SCREEN BREAST BILATERAL        Return in about 1 year (around 07/26/2021) for CPE.     Danelle Earthly [place],acting as a Neurosurgeon for Shirlee Latch, MD.,have documented all relevant documentation on the behalf of Shirlee Latch, MD,as directed by  Shirlee Latch, MD while in the presence  of Shirlee Latch, MD.  I, Shirlee Latch, MD, have reviewed all documentation for this visit. The documentation on 07/26/20 for the exam, diagnosis, procedures, and orders are all accurate and complete.   Joliana Claflin, Marzella Schlein, MD, MPH [facility]

## 2020-07-27 LAB — CBC WITH DIFFERENTIAL/PLATELET
Basophils Absolute: 0 10*3/uL (ref 0.0–0.2)
Basos: 1 %
EOS (ABSOLUTE): 0.1 10*3/uL (ref 0.0–0.4)
Eos: 2 %
Hematocrit: 39.2 % (ref 34.0–46.6)
Hemoglobin: 12.8 g/dL (ref 11.1–15.9)
Immature Grans (Abs): 0 10*3/uL (ref 0.0–0.1)
Immature Granulocytes: 0 %
Lymphocytes Absolute: 1.5 10*3/uL (ref 0.7–3.1)
Lymphs: 38 %
MCH: 30.1 pg (ref 26.6–33.0)
MCHC: 32.7 g/dL (ref 31.5–35.7)
MCV: 92 fL (ref 79–97)
Monocytes Absolute: 0.3 10*3/uL (ref 0.1–0.9)
Monocytes: 7 %
Neutrophils Absolute: 2.1 10*3/uL (ref 1.4–7.0)
Neutrophils: 52 %
Platelets: 207 10*3/uL (ref 150–450)
RBC: 4.25 x10E6/uL (ref 3.77–5.28)
RDW: 12.6 % (ref 11.7–15.4)
WBC: 4 10*3/uL (ref 3.4–10.8)

## 2020-07-27 LAB — COMPREHENSIVE METABOLIC PANEL
ALT: 11 IU/L (ref 0–32)
AST: 12 IU/L (ref 0–40)
Albumin/Globulin Ratio: 2.2 (ref 1.2–2.2)
Albumin: 4.7 g/dL (ref 3.8–4.8)
Alkaline Phosphatase: 75 IU/L (ref 44–121)
BUN/Creatinine Ratio: 16 (ref 9–23)
BUN: 13 mg/dL (ref 6–20)
Bilirubin Total: 0.7 mg/dL (ref 0.0–1.2)
CO2: 22 mmol/L (ref 20–29)
Calcium: 9.8 mg/dL (ref 8.7–10.2)
Chloride: 100 mmol/L (ref 96–106)
Creatinine, Ser: 0.81 mg/dL (ref 0.57–1.00)
Globulin, Total: 2.1 g/dL (ref 1.5–4.5)
Glucose: 82 mg/dL (ref 65–99)
Potassium: 4.1 mmol/L (ref 3.5–5.2)
Sodium: 135 mmol/L (ref 134–144)
Total Protein: 6.8 g/dL (ref 6.0–8.5)
eGFR: 95 mL/min/{1.73_m2} (ref >59)

## 2020-07-27 LAB — LIPID PANEL
Chol/HDL Ratio: 1.8 ratio (ref 0.0–4.4)
Cholesterol, Total: 238 mg/dL — ABNORMAL HIGH (ref 100–199)
HDL: 129 mg/dL (ref >39)
LDL Chol Calc (NIH): 98 mg/dL (ref 0–99)
Triglycerides: 66 mg/dL (ref 0–149)
VLDL Cholesterol Cal: 11 mg/dL (ref 5–40)

## 2020-07-27 LAB — HEPATITIS C ANTIBODY: Hep C Virus Ab: <0.1 s/co ratio (ref 0.0–0.9)

## 2020-08-07 ENCOUNTER — Encounter: Payer: Self-pay | Admitting: Family Medicine

## 2020-08-08 ENCOUNTER — Telehealth: Payer: Self-pay

## 2020-08-08 NOTE — Telephone Encounter (Signed)
Pt. Called to review labs. States she was concerned total cholesterol was elevated. States she will watch her diet to bring it down.

## 2020-10-13 ENCOUNTER — Other Ambulatory Visit: Payer: Self-pay | Admitting: Obstetrics & Gynecology

## 2020-10-13 DIAGNOSIS — F4321 Adjustment disorder with depressed mood: Secondary | ICD-10-CM

## 2020-10-13 NOTE — Telephone Encounter (Signed)
Sch Annual

## 2020-10-14 NOTE — Telephone Encounter (Signed)
Patient is scheduled for 10/13/222 with [facility]

## 2020-11-07 ENCOUNTER — Ambulatory Visit: Payer: Medicaid Other | Admitting: Obstetrics & Gynecology

## 2020-11-27 ENCOUNTER — Telehealth: Payer: Self-pay

## 2020-11-27 ENCOUNTER — Ambulatory Visit: Payer: Self-pay | Admitting: *Deleted

## 2020-11-27 NOTE — Telephone Encounter (Signed)
Reason for Disposition  [1] Continuous (nonstop) coughing interferes with work or school AND [2] no improvement using cough treatment per Care Advice  Answer Assessment - Initial Assessment Questions 1. ONSET: "When did the cough begin?"      Monday 2. SEVERITY: "How bad is the cough today?"      constant 3. SPUTUM: "Describe the color of your sputum" (none, dry cough; clear, white, yellow, green)     yellow 4. HEMOPTYSIS: "Are you coughing up any blood?" If so ask: "How much?" (flecks, streaks, tablespoons, etc.)     no 5. DIFFICULTY BREATHING: "Are you having difficulty breathing?" If Yes, ask: "How bad is it?" (e.g., mild, moderate, severe)    - MILD: No SOB at rest, mild SOB with walking, speaks normally in sentences, can lie down, no retractions, pulse < 100.    - MODERATE: SOB at rest, SOB with minimal exertion and prefers to sit, cannot lie down flat, speaks in phrases, mild retractions, audible wheezing, pulse 100-120.    - SEVERE: Very SOB at rest, speaks in single words, struggling to breathe, sitting hunched forward, retractions, pulse > 120      no 6. FEVER: "Do you have a fever?" If Yes, ask: "What is your temperature, how was it measured, and when did it start?"     On Friday/Saturday- chills/hot-not measure 7. CARDIAC HISTORY: "Do you have any history of heart disease?" (e.g., heart attack, congestive heart failure)      no 8. LUNG HISTORY: "Do you have any history of lung disease?"  (e.g., pulmonary embolus, asthma, emphysema)     no 9. PE RISK FACTORS: "Do you have a history of blood clots?" (or: recent major surgery, recent prolonged travel, bedridden)     no 10. OTHER SYMPTOMS: "Do you have any other symptoms?" (e.g., runny nose, wheezing, chest pain)       Sore throat, congestion, swollen neck nodes 11. PREGNANCY: "Is there any chance you are pregnant?" "When was your last menstrual period?"       No- LMP-10/31/20 12. TRAVEL: "Have you traveled out of the country in  the last month?" (e.g., travel history, exposures)       no  Protocols used: Cough - Acute Productive-A-AH

## 2020-11-27 NOTE — Telephone Encounter (Signed)
Mychart message sent.

## 2020-11-27 NOTE — Telephone Encounter (Signed)
Patient is calling to report she had symptoms -fever, sore throat that started Friday- and seemed to improve over the weekend- but Monday she started coughing and now has swollen glands in her neck. Patient has not done COVID testing and advised she needs to rule COVID out as reason for her symptoms. Patient advised if seen at office she would have to be end of day- patient wants to be seen and not do virtual visit. Patient advised I would send message to office for review of request.

## 2020-11-27 NOTE — Telephone Encounter (Signed)
Copied from CRM [id number]. Topic: Appointment Scheduling - Scheduling Inquiry for Clinic >> Nov 27, 2020  9:05 AM Beverly Daniels wrote: Reason for CRM: Pt is not feeling well sore throat, coughing to point of not being able to sleep, congestion, no fever, pt wants to be seen today if possible with one of new providers, wants a fu call at [phone]

## 2020-11-28 NOTE — Telephone Encounter (Signed)
Can offer any available appts with any provider that is FIT tested, [facility] virtual visit, [place] offices for acute visit. Agree that she needs to COVID test at home.

## 2020-11-28 NOTE — Telephone Encounter (Signed)
Left message advising as below.  

## 2020-12-06 ENCOUNTER — Ambulatory Visit: Payer: Medicaid Other | Admitting: Obstetrics & Gynecology

## 2020-12-27 ENCOUNTER — Other Ambulatory Visit: Payer: Self-pay | Admitting: Obstetrics & Gynecology

## 2020-12-27 DIAGNOSIS — F4321 Adjustment disorder with depressed mood: Secondary | ICD-10-CM

## 2020-12-30 ENCOUNTER — Other Ambulatory Visit: Payer: Self-pay | Admitting: Obstetrics & Gynecology

## 2020-12-30 DIAGNOSIS — F4321 Adjustment disorder with depressed mood: Secondary | ICD-10-CM

## 2020-12-30 NOTE — Telephone Encounter (Signed)
Pt past due for annual. Have her schedule and then we'll RF to get her to appt.

## 2020-12-30 NOTE — Telephone Encounter (Signed)
Can you refill this for [facility]?

## 2021-03-26 ENCOUNTER — Other Ambulatory Visit: Payer: Self-pay | Admitting: Obstetrics & Gynecology

## 2021-03-26 DIAGNOSIS — F4321 Adjustment disorder with depressed mood: Secondary | ICD-10-CM

## 2021-04-09 ENCOUNTER — Ambulatory Visit: Payer: Medicaid Other | Admitting: Family Medicine

## 2021-04-09 ENCOUNTER — Other Ambulatory Visit: Payer: Self-pay | Admitting: Obstetrics & Gynecology

## 2021-04-09 DIAGNOSIS — F4321 Adjustment disorder with depressed mood: Secondary | ICD-10-CM

## 2021-04-10 MED ORDER — SERTRALINE HCL 100 MG PO TABS: 100.0000 mg | ORAL_TABLET | Freq: Every day | ORAL | 0 refills | Status: DC

## 2021-04-16 ENCOUNTER — Encounter: Payer: Self-pay | Admitting: Family Medicine

## 2021-04-16 ENCOUNTER — Other Ambulatory Visit: Payer: Self-pay

## 2021-04-16 ENCOUNTER — Ambulatory Visit (INDEPENDENT_AMBULATORY_CARE_PROVIDER_SITE_OTHER): Payer: Medicaid Other | Admitting: Family Medicine

## 2021-04-16 VITALS — BP 120/60 | Ht 66.0 in | Wt 141.0 lb

## 2021-04-16 DIAGNOSIS — Z9079 Acquired absence of other genital organ(s): Secondary | ICD-10-CM | POA: Diagnosis not present

## 2021-04-16 DIAGNOSIS — Z01419 Encounter for gynecological examination (general) (routine) without abnormal findings: Secondary | ICD-10-CM | POA: Diagnosis not present

## 2021-04-16 NOTE — Progress Notes (Signed)
? ?  GYNECOLOGY ANNUAL PREVENTATIVE CARE ENCOUNTER NOTE ? ?Subjective:  ? Beverly Daniels is a 41 y.o. [phone] female here for a routine annual gynecologic exam.  Current complaints: none.    ? ?Menses are regular periods every 28 days, last < 7 days.  ?Reports midcycle "twinges", happen regularly between cycles, mild in nature.  ? ?Denies abnormal vaginal bleeding, discharge, pelvic pain, problems with intercourse or other gynecologic concerns.  ?  ?Gynecologic History ?Patient's last menstrual period was 03/20/2021. ?Contraception: NFP/withdrawl ?Last Pap: 2021. Results were: normal, HPV neg ?Last mammogram: NA-- needs referral today ? ?Health Maintenance Due  ?Topic Date Due  ? COVID-19 Vaccine (1) Never done  ? TETANUS/TDAP  06/26/2019  ? INFLUENZA VACCINE  Never done  ? ? ?The following portions of the patient's history were reviewed and updated as appropriate: allergies, current medications, past family history, past medical history, past social history, past surgical history and problem list. ? ?Review of Systems ?Pertinent items are noted in HPI. ?  ?Objective:  ?BP 120/60   Ht 5\' 6"  (1.676 m)   Wt 141 lb (64 kg)   LMP 03/20/2021   BMI 22.76 kg/m?  ?CONSTITUTIONAL: Well-developed, well-nourished female in no acute distress.  ?HENT:  Normocephalic, atraumatic, External right and left ear normal. Oropharynx is clear and moist ?EYES:  No scleral icterus.  ?NECK: Normal range of motion, supple, no masses.  Normal thyroid.  ?SKIN: Skin is warm and dry. No rash noted. Not diaphoretic. No erythema. No pallor. ?NEUROLOGIC: Alert and oriented to person, place, and time. Normal reflexes, muscle tone coordination. No cranial nerve deficit noted. ?PSYCHIATRIC: Normal mood and affect. Normal behavior. Normal judgment and thought content. ?CARDIOVASCULAR: Normal heart rate noted, regular rhythm. 2+ distal pulses. ?RESPIRATORY: Effort and breath sounds normal, no problems with respiration noted. ?BREASTS: Symmetric  in size. No masses, skin changes, nipple drainage, or lymphadenopathy. ?ABDOMEN: Soft,  no distention noted.  No tenderness, rebound or guarding.  ?PELVIC: Normal appearing external genitalia; normal appearing vaginal mucosa. No abnormal discharge noted.  Normal uterine size, no other palpable masses, no uterine or adnexal tenderness. ?MUSCULOSKELETAL: Normal range of motion.  ? ? ?Assessment and Plan:  ?1) Annual gynecologic examination pap not indicated:  Will follow up results of pap smear and manage accordingly. STI screening desired No.  Routine preventative health maintenance measures emphasized. Reviewed perimenopausal symptoms and management.  ? ?2) Contraception counseling: Patient is considering a pregnancy in the next year. She and partner use fertility awareness and withdrawal methods. She is comfortable were she to become pregnant. We discussed starting prenatal vitamin.  ? ?1. Well woman exam with routine gynecological exam ?Active mom, normal BMI ?Has PCP  ?Had active mammogram order, encouraged to schedule mammogram ?Reviewed contraception and possible pregnancy plans ?I think her cramping/abdominal pain is likely ovulatory. Not able to reproduce on exam.  ? ?2. H/O unilateral salpingectomy ?Left tube removed still has right.  ? ? ?Please refer to After Visit Summary for other counseling recommendations.  ? ?No follow-ups on file. ? ?03/22/2021, MD, MPH, ABFM ?Attending Physician ?[facility] ? ?

## 2021-07-07 ENCOUNTER — Other Ambulatory Visit: Payer: Self-pay | Admitting: Family Medicine

## 2021-07-07 DIAGNOSIS — F4321 Adjustment disorder with depressed mood: Secondary | ICD-10-CM

## 2021-07-07 MED ORDER — SERTRALINE HCL 100 MG PO TABS: 100.0000 mg | ORAL_TABLET | Freq: Every day | ORAL | 0 refills | Status: DC

## 2021-07-31 ENCOUNTER — Encounter: Payer: Self-pay | Admitting: Family Medicine

## 2021-08-11 ENCOUNTER — Ambulatory Visit: Payer: Self-pay | Admitting: *Deleted

## 2021-08-11 ENCOUNTER — Telehealth: Payer: Self-pay | Admitting: Family Medicine

## 2021-08-11 NOTE — Telephone Encounter (Signed)
I called and spoke with pt to confirm she is talking about something for anxiety prior to upcoming appt on Wednesday.  She is requesting a temporary Rx for Klonipin 0.5 mg.  States her husband is out of town and she is feeling very anxious and this has helped her in the past.  Please advise.

## 2021-08-11 NOTE — Telephone Encounter (Signed)
Please Review

## 2021-08-11 NOTE — Telephone Encounter (Signed)
Please, let pt know that we could not Rx controlled-substance medications. Pt has an appt scheduled on 08/13/21. We will discuss her treatment options.

## 2021-08-11 NOTE — Telephone Encounter (Signed)
Patient called in states UC is 35 min away from her for anxiety. She spoke with NT at :81, today. See note , today 07/17. She is asking for med, clonazapam be sent instead of xanax, because clonazepam seems to work better,  until her appt on 07/19.Marland Kitchen Please call back. pharmacy is  [id number] [place], [place] - [address] AT [facility] [address] Phone:  [phone]  Fax:  [phone]

## 2021-08-11 NOTE — Telephone Encounter (Signed)
  Chief Complaint: Depression and anxiety Symptoms: "Usual symptoms" Diarrhea, did not sleep last night,nausea, shaky, HR elevated Frequency: Last night Pertinent Negatives: Patient denies palpations  Disposition: [] ED /[] Urgent Care (no appt availability in office) / [x] Appointment(In office/virtual)/ []  [facility]/ [] Home Care/ [] Refused Recommended Disposition /[] [facility]/ []  Follow-up with PCP Additional Notes: Triggered by "Facebook story, scary."   Pt would like referral to therapist. "Maybe medication for anxiety" Pt on Zoloft. No availability at practice until Wednesday, secured and placed on WaitList. Advised [facility] for worsening symptoms. Care advise provided, information on [facility] provided as well.

## 2021-08-12 NOTE — Progress Notes (Signed)
Established patient visit  I,Joseline E Rosas,acting as a scribe for [employer], PA-C.,have documented all relevant documentation on the behalf of Debera Lat, PA-C,as directed by  [employer], PA-C while in the presence of [employer], PA-C.   Patient: Beverly Daniels   DOB: 02-Aug-1980   40 y.o. Female  MRN: [id number] Visit Date: 08/13/2021  Today's healthcare provider: Debera Lat, PA-C   Chief Complaint  Patient presents with   Anxiety   Subjective    HPI  Anxiety: Patient complains of anxiety disorder.  She has the following symptoms: difficulty concentrating, fatigue, feelings of losing control, insomnia, shortness of breath, sweating, racing thoughts. Onset of symptoms was approximately a few days ago, unchanged since that time. She denies current suicidal and homicidal ideation. Risk factors:  adjustment disorder, patient on Zoloft 100 mg.    Patient was seen in office back in 07/2020 and a referral to psychology was placed for GAD and adjustment disorder with depressed mood. Patient requesting new referral.     08/13/2021    1:09 PM 07/26/2020    9:28 AM 02/01/2020   10:12 AM  Depression screen PHQ 2/9  Decreased Interest 0 0 0  Down, Depressed, Hopeless 1 0 0  PHQ - 2 Score 1 0 0  Altered sleeping 1 1 0  Tired, decreased energy 1  0  Change in appetite 1 0 0  Feeling bad or failure about yourself  1 0 0  Trouble concentrating 1 0 0  Moving slowly or fidgety/restless 2 0 0  Suicidal thoughts 0 0 0  PHQ-9 Score 8 1 0  Difficult doing work/chores Not difficult at all Not difficult at all Not difficult at all       08/13/2021    1:13 PM 07/26/2020    9:41 AM  GAD 7 : Generalized Anxiety Score  Nervous, Anxious, on Edge 1 1  Control/stop worrying 1 0  Worry too much - different things 0 1  Trouble relaxing 1 1  Restless 1 0  Easily annoyed or irritable 1 1  Afraid - awful might happen 2 1  Total GAD 7 Score 7 5  Anxiety Difficulty Somewhat  difficult Somewhat difficult     Medications: Outpatient Medications Prior to Visit  Medication Sig   sertraline (ZOLOFT) 100 MG tablet Take 1 tablet (100 mg total) by mouth daily. Please schedule office visit before any future refill.   Collagen-Vitamin C-Biotin 500-50-0.8 MG CAPS Take 1 tablet by mouth daily.   fluticasone (FLONASE) 50 MCG/ACT nasal spray Place 2 sprays into both nostrils daily.   Multiple Vitamin (MULTIVITAMIN) tablet Take 1 tablet by mouth daily.   No facility-administered medications prior to visit.    Review of Systems  All other systems reviewed and are negative. Except see HPI     Objective    BP 117/67 (BP Location: Right Arm, Patient Position: Sitting, Cuff Size: Normal)   Pulse 67   Temp 98.1 F (36.7 C) (Oral)   Resp 16   Ht 5\' 6"  (1.676 m)   Wt 127 lb 11.2 oz (57.9 kg)   LMP 07/29/2021   BMI 20.61 kg/m    Physical Exam Vitals reviewed.  Constitutional:      General: She is not in acute distress.    Appearance: Normal appearance. She is well-developed. She is not diaphoretic.  HENT:     Head: Normocephalic and atraumatic.     Nose: Nose normal.  Eyes:  General: No scleral icterus.    Extraocular Movements: Extraocular movements intact.     Conjunctiva/sclera: Conjunctivae normal.     Pupils: Pupils are equal, round, and reactive to light.  Neck:     Thyroid: No thyromegaly.  Cardiovascular:     Rate and Rhythm: Normal rate and regular rhythm.     Pulses: Normal pulses.     Heart sounds: Normal heart sounds. No murmur heard. Pulmonary:     Effort: Pulmonary effort is normal. No respiratory distress.     Breath sounds: Normal breath sounds. No wheezing, rhonchi or rales.  Abdominal:     General: Abdomen is flat.     Palpations: Abdomen is soft.  Musculoskeletal:        General: Normal range of motion.     Cervical back: Normal range of motion and neck supple.     Right lower leg: No edema.     Left lower leg: No edema.   Lymphadenopathy:     Cervical: No cervical adenopathy.  Skin:    General: Skin is warm and dry.     Capillary Refill: Capillary refill takes less than 2 seconds.     Findings: No rash.  Neurological:     General: No focal deficit present.     Mental Status: She is alert and oriented to person, place, and time. Mental status is at baseline.  Psychiatric:        Behavior: Behavior normal.        Thought Content: Thought content normal.        Judgment: Judgment normal.       No results found for any visits on 08/13/21.  Assessment & Plan     1. GAD (generalized anxiety disorder)  - Ambulatory referral to Psychiatry  2. Adjustment disorder with depressed mood  - Ambulatory referral to Psychiatry/pt requested Dr. Maryruth Bun We discussed in length her current treatment options including drug interactions, benefits, risk factors and short-term vs long-term goals in the therapy  Fu as scheduled The patient was advised to call back or seek an in-person evaluation if the symptoms worsen or if the condition fails to improve as anticipated.  I discussed the assessment and treatment plan with the patient. The patient was provided an opportunity to ask questions and all were answered. The patient agreed with the plan and demonstrated an understanding of the instructions.  The entirety of the information documented in the History of Present Illness, Review of Systems and Physical Exam were personally obtained by me. Portions of this information were initially documented by the CMA and reviewed by me for thoroughness and accuracy.  Portions of this note were created using dictation software and may contain typographical errors.       Total encounter time more than 30 minutes  Greater than 50% was spent in counseling and coordination of care with the patient    Cherlynn Polo  [facility] [phone] (phone) [phone] (fax)  [facility]

## 2021-08-12 NOTE — Telephone Encounter (Signed)
Pt informed and states she is feeling a little better today.

## 2021-08-13 ENCOUNTER — Encounter: Payer: Self-pay | Admitting: Physician Assistant

## 2021-08-13 ENCOUNTER — Ambulatory Visit (INDEPENDENT_AMBULATORY_CARE_PROVIDER_SITE_OTHER): Payer: Medicaid Other | Admitting: Physician Assistant

## 2021-08-13 VITALS — BP 117/67 | HR 67 | Temp 98.1°F | Resp 16 | Ht 66.0 in | Wt 127.7 lb

## 2021-08-13 DIAGNOSIS — F4321 Adjustment disorder with depressed mood: Secondary | ICD-10-CM

## 2021-08-13 DIAGNOSIS — F411 Generalized anxiety disorder: Secondary | ICD-10-CM | POA: Diagnosis not present

## 2021-08-18 ENCOUNTER — Telehealth: Payer: Self-pay

## 2021-08-18 NOTE — Telephone Encounter (Signed)
Copied from CRM [id number]. Topic: Referral - Status >> Aug 18, 2021 11:18 AM Franchot Heidelberg wrote: Reason for CRM: Gunnar Fusi from Dr. Shelda Altes office called requesting to speak to referrals  Her message: "We are out of network with the patient's insurance so we will be unable to see the patient. She recommends sending Referral to [facility]"  [facility]  Phone number: [phone]

## 2021-08-22 NOTE — Telephone Encounter (Signed)
noted 

## 2021-10-06 ENCOUNTER — Other Ambulatory Visit: Payer: Self-pay | Admitting: Family Medicine

## 2021-10-06 DIAGNOSIS — F4321 Adjustment disorder with depressed mood: Secondary | ICD-10-CM

## 2022-01-01 ENCOUNTER — Other Ambulatory Visit: Payer: Self-pay | Admitting: Physician Assistant

## 2022-01-01 DIAGNOSIS — F4321 Adjustment disorder with depressed mood: Secondary | ICD-10-CM

## 2022-01-01 NOTE — Telephone Encounter (Signed)
Requested Prescriptions  Pending Prescriptions Disp Refills   sertraline (ZOLOFT) 100 MG tablet [Pharmacy Med Name: SERTRALINE 100MG  TABLETS] 90 tablet 0    Sig: TAKE 1 TABLET(100 MG) BY MOUTH DAILY     Psychiatry:  Antidepressants - SSRI - sertraline Failed - 01/01/2022  3:35 AM      Failed - AST in normal range and within 360 days    AST  Date Value Ref Range Status  07/26/2020 12 0 - 40 IU/L Final   SGOT(AST)  Date Value Ref Range Status  07/04/2011 16 15 - 37 Unit/L Final         Failed - ALT in normal range and within 360 days    ALT  Date Value Ref Range Status  07/26/2020 11 0 - 32 IU/L Final   SGPT (ALT)  Date Value Ref Range Status  07/04/2011 23 U/L Final    Comment:    12-78 NOTE: NEW REFERENCE RANGE 12/19/2010          Passed - Completed PHQ-2 or PHQ-9 in the last 360 days      Passed - Valid encounter within last 6 months    Recent Outpatient Visits           4 months ago GAD (generalized anxiety disorder)   12/21/2010, [place], PA-C   1 year ago Encounter for annual physical exam   [place], [facility], MD   1 year ago Benign paroxysmal positional vertigo, unspecified laterality   [facility], [facility], MD

## 2022-01-05 NOTE — Progress Notes (Signed)
I,Harriette Tovey S Azzure Garabedian,acting as a scribe for Shirlee Latch, MD.,have documented all relevant documentation on the behalf of Shirlee Latch, MD,as directed by  Shirlee Latch, MD while in the presence of Shirlee Latch, MD.   Established patient visit   Patient: Beverly Daniels   DOB: 09-29-1980   41 y.o. Female  MRN: [id number] Visit Date: 01/06/2022  Today's healthcare provider: Shirlee Latch, MD   Chief Complaint  Patient presents with   Referral   Subjective    HPI  Patient requesting referral to dermatology to rule out melasma.   Was told by [facility] that she couldn't have broad band laser therapy if it was melasma.  Noticed color change on face when spending more time in the sun, maybe during pregnancy.  On no hormone therapy now.   Medications: Outpatient Medications Prior to Visit  Medication Sig   clarithromycin (BIAXIN) 250 MG tablet Take 250 mg by mouth daily.   Collagen-Vitamin C-Biotin 500-50-0.8 MG CAPS Take 1 tablet by mouth daily.   fluticasone (FLONASE) 50 MCG/ACT nasal spray Place 2 sprays into both nostrils daily.   sertraline (ZOLOFT) 100 MG tablet TAKE 1 TABLET(100 MG) BY MOUTH DAILY   No facility-administered medications prior to visit.    Review of Systems  Constitutional:  Negative for appetite change and fatigue.  Respiratory:  Negative for chest tightness.   Cardiovascular:  Negative for chest pain.  Gastrointestinal:  Negative for abdominal pain, nausea and vomiting.  Skin:  Positive for color change.       Objective    BP 107/73 (BP Location: Right Arm, Patient Position: Sitting, Cuff Size: Normal)   Pulse 62   Temp 98.5 F (36.9 C) (Oral)   Resp 16   Wt 132 lb 9.6 oz (60.1 kg)   LMP 12/22/2021 (Approximate)   BMI 21.40 kg/m  BP Readings from Last 3 Encounters:  01/06/22 107/73  08/13/21 117/67  04/16/21 120/60   Wt Readings from Last 3 Encounters:  01/06/22 132 lb 9.6 oz (60.1 kg)  08/13/21 127 lb 11.2  oz (57.9 kg)  04/16/21 141 lb (64 kg)      Physical Exam Vitals reviewed.  Constitutional:      General: She is not in acute distress.    Appearance: She is well-developed.  HENT:     Head: Normocephalic and atraumatic.  Eyes:     General: No scleral icterus.    Conjunctiva/sclera: Conjunctivae normal.  Cardiovascular:     Rate and Rhythm: Normal rate and regular rhythm.  Pulmonary:     Effort: Pulmonary effort is normal. No respiratory distress.  Skin:    General: Skin is warm and dry.     Findings: No rash.     Comments: Dark spot changes on face  Neurological:     Mental Status: She is alert and oriented to person, place, and time.  Psychiatric:        Behavior: Behavior normal.       No results found for any visits on 01/06/22.  Assessment & Plan     Problem List Items Addressed This Visit   None Visit Diagnoses     Skin change    -  Primary   Relevant Orders   Ambulatory referral to Dermatology      - ongoing issue - wanted laser therapy, but concern for possible melasma - will get derm eval and it will help determine future treatment - encourage sunscreen use and safe sun precautions  Return in about 7 months (around 08/07/2022) for CPE.      I, Shirlee Latch, MD, have reviewed all documentation for this visit. The documentation on 01/06/22 for the exam, diagnosis, procedures, and orders are all accurate and complete.   , Marzella Schlein, MD, MPH [facility]

## 2022-01-06 ENCOUNTER — Ambulatory Visit: Payer: Medicaid Other | Admitting: Family Medicine

## 2022-01-06 ENCOUNTER — Encounter: Payer: Self-pay | Admitting: Family Medicine

## 2022-01-06 VITALS — BP 107/73 | HR 62 | Temp 98.5°F | Resp 16 | Wt 132.6 lb

## 2022-01-06 DIAGNOSIS — R239 Unspecified skin changes: Secondary | ICD-10-CM | POA: Diagnosis not present

## 2022-04-03 ENCOUNTER — Other Ambulatory Visit: Payer: Self-pay | Admitting: Family Medicine

## 2022-04-03 DIAGNOSIS — F4321 Adjustment disorder with depressed mood: Secondary | ICD-10-CM

## 2022-07-04 ENCOUNTER — Other Ambulatory Visit: Payer: Self-pay | Admitting: Family Medicine

## 2022-07-04 DIAGNOSIS — F4321 Adjustment disorder with depressed mood: Secondary | ICD-10-CM

## 2022-07-06 NOTE — Telephone Encounter (Signed)
Requested Prescriptions  Pending Prescriptions Disp Refills   sertraline (ZOLOFT) 100 MG tablet [Pharmacy Med Name: SERTRALINE 100MG  TABLETS] 90 tablet 0    Sig: TAKE 1 TABLET(100 MG) BY MOUTH DAILY     Psychiatry:  Antidepressants - SSRI - sertraline Failed - 07/04/2022  3:34 AM      Failed - AST in normal range and within 360 days    AST  Date Value Ref Range Status  07/26/2020 12 0 - 40 IU/L Final   SGOT(AST)  Date Value Ref Range Status  07/04/2011 16 15 - 37 Unit/L Final         Failed - ALT in normal range and within 360 days    ALT  Date Value Ref Range Status  07/26/2020 11 0 - 32 IU/L Final   SGPT (ALT)  Date Value Ref Range Status  07/04/2011 23 U/L Final    Comment:    12-78 NOTE: NEW REFERENCE RANGE 12/19/2010          Passed - Completed PHQ-2 or PHQ-9 in the last 360 days      Passed - Valid encounter within last 6 months    Recent Outpatient Visits           6 months ago Skin change   [place] [facility] [place], Marzella Schlein, MD   10 months ago GAD (generalized anxiety disorder)   [place] [facility] [place], [place], PA-C   1 year ago Encounter for annual physical exam   [place] [facility] [place], Marzella Schlein, MD   2 years ago Benign paroxysmal positional vertigo, unspecified laterality    [facility] [place], Marzella Schlein, MD       Future Appointments             In 1 month [place], Marzella Schlein, MD [facility], [facility]

## 2022-08-10 ENCOUNTER — Ambulatory Visit: Payer: Medicaid Other | Admitting: Family Medicine

## 2022-08-10 ENCOUNTER — Encounter: Payer: Self-pay | Admitting: Family Medicine

## 2022-08-10 VITALS — BP 107/68 | HR 50 | Temp 97.9°F | Resp 13 | Ht 66.0 in | Wt 132.5 lb

## 2022-08-10 DIAGNOSIS — Z0000 Encounter for general adult medical examination without abnormal findings: Secondary | ICD-10-CM | POA: Diagnosis not present

## 2022-08-10 DIAGNOSIS — F4321 Adjustment disorder with depressed mood: Secondary | ICD-10-CM | POA: Diagnosis not present

## 2022-08-10 DIAGNOSIS — R0982 Postnasal drip: Secondary | ICD-10-CM | POA: Diagnosis not present

## 2022-08-10 DIAGNOSIS — Z1231 Encounter for screening mammogram for malignant neoplasm of breast: Secondary | ICD-10-CM | POA: Diagnosis not present

## 2022-08-10 MED ORDER — SERTRALINE HCL 100 MG PO TABS: 100.0000 mg | ORAL_TABLET | Freq: Every day | ORAL | 3 refills | Status: DC

## 2022-08-10 NOTE — Progress Notes (Signed)
Complete physical exam   Patient: Beverly Daniels   DOB: 01/20/1981   42 y.o. Female  MRN: [id number] Visit Date: 08/10/2022  Today's healthcare provider: Shirlee Latch, MD   Chief Complaint  Patient presents with   Annual Exam   Sore Throat    Patient state she has been having a sore throat on and off for a couple weeks now and was having some pain in the sinus area this morning.   Subjective    Beverly Daniels is a 42 y.o. female who presents today for a complete physical exam.  She reports consuming a general diet.  She generally feels well. She reports sleeping fairly well. She does have additional problems to discuss today.  HPI HPI     Sore Throat    Additional comments: Patient state she has been having a sore throat on and off for a couple weeks now and was having some pain in the sinus area this morning.      Last edited by Lubertha Basque, CMA on 08/10/2022  2:52 PM.       Discussed the use of AI scribe software for clinical note transcription with the patient, who gave verbal consent to proceed.  History of Present Illness   The patient presents for a physical and reports a recurring sore throat that began on June 24th or 25th. The sore throat initially resolved, but has since returned multiple times. The patient describes the sensation as a tickling feeling, similar to a cold, and also reports postnasal drip, especially in the mornings. The patient denies having any known allergies to pollen and did not have any pets present during a recent vacation when the symptoms occurred. The patient also reports swollen lymph nodes during the last episode of sore throat about a week ago. The patient is currently on sertraline 100mg  daily for adjustment disorder and reports that it is effective in managing their mood.       Past Medical History:  Diagnosis Date   Anxiety    postpartum    Clotting disorder (HCC)    History of ITP   Depression    postpartum    Ectopic pregnancy    History of ITP    Age 70 but is no longer present according to patient   Past Surgical History:  Procedure Laterality Date   LAPAROSCOPIC OVARIAN CYSTECTOMY Left 07/09/2011   salpingectomy Left    Social History   Socioeconomic History   Marital status: Married    Spouse name: Not on file   Number of children: 4   Years of education: Not on file   Highest education level: Not on file  Occupational History   Occupation: stay at home mom  Tobacco Use   Smoking status: Never   Smokeless tobacco: Never  Vaping Use   Vaping status: Never Used  Substance and Sexual Activity   Alcohol use: No    Comment: rarely   Drug use: No   Sexual activity: Yes    Partners: Male    Birth control/protection: None  Other Topics Concern   Not on file  Social History Narrative   Not on file   Social Determinants of Health   Financial Resource Strain: Not on file  Food Insecurity: Not on file  Transportation Needs: Not on file  Physical Activity: Not on file  Stress: Not on file  Social Connections: Not on file  Intimate Partner Violence: Not on file   Family Status  Relation Name Status   Mother  (Not Specified)   MGM  Deceased   PGM  (Not Specified)   Neg Hx  (Not Specified)  No partnership data on file   Family History  Problem Relation Age of Onset   COPD Mother        non smoker   Atrial fibrillation Mother    Macular degeneration Mother    Congestive Heart Failure Maternal Grandmother    Stroke Maternal Grandmother    Skin cancer Maternal Grandmother    Anxiety disorder Paternal Grandmother    Depression Paternal Grandmother    Breast cancer Neg Hx    Colon cancer Neg Hx    Allergies  Allergen Reactions   Amoxicillin Swelling, Rash and Itching    Swelling on face; itchy all over; rash all over as well   Cortisone Palpitations and Other (See Comments)    Patient passed out after cortisone shot.     Patient Care Team: Erasmo Downer,  MD as PCP - General (Family Medicine)   Medications: Outpatient Medications Prior to Visit  Medication Sig   [DISCONTINUED] sertraline (ZOLOFT) 100 MG tablet TAKE 1 TABLET(100 MG) BY MOUTH DAILY   fluticasone (FLONASE) 50 MCG/ACT nasal spray Place 2 sprays into both nostrils daily. (Patient not taking: Reported on 08/10/2022)   [DISCONTINUED] Collagen-Vitamin C-Biotin 500-50-0.8 MG CAPS Take 1 tablet by mouth daily. (Patient not taking: Reported on 08/10/2022)   No facility-administered medications prior to visit.    Review of Systems per HPI      Objective    BP 107/68 (BP Location: Left Arm, Patient Position: Sitting, Cuff Size: Normal)   Pulse (!) 50   Temp 97.9 F (36.6 C) (Oral)   Resp 13   Ht 5\' 6"  (1.676 m)   Wt 132 lb 8 oz (60.1 kg)   LMP 07/31/2022 (Approximate)   SpO2 99%   BMI 21.39 kg/m       Physical Exam Vitals reviewed.  Constitutional:      General: She is not in acute distress.    Appearance: Normal appearance. She is well-developed. She is not diaphoretic.  HENT:     Head: Normocephalic and atraumatic.     Right Ear: Tympanic membrane, ear canal and external ear normal.     Left Ear: Tympanic membrane, ear canal and external ear normal.     Nose: Nose normal.     Mouth/Throat:     Mouth: Mucous membranes are moist.     Pharynx: Oropharynx is clear. No oropharyngeal exudate.  Eyes:     General: No scleral icterus.    Conjunctiva/sclera: Conjunctivae normal.     Pupils: Pupils are equal, round, and reactive to light.  Neck:     Thyroid: No thyromegaly.  Cardiovascular:     Rate and Rhythm: Normal rate and regular rhythm.     Pulses: Normal pulses.     Heart sounds: Normal heart sounds. No murmur heard. Pulmonary:     Effort: Pulmonary effort is normal. No respiratory distress.     Breath sounds: Normal breath sounds. No wheezing or rales.  Abdominal:     General: There is no distension.     Palpations: Abdomen is soft.     Tenderness:  There is no abdominal tenderness.  Musculoskeletal:        General: No deformity.     Cervical back: Neck supple.     Right lower leg: No edema.     Left lower leg: No  edema.  Lymphadenopathy:     Cervical: No cervical adenopathy.  Skin:    General: Skin is warm and dry.     Findings: No rash.  Neurological:     Mental Status: She is alert and oriented to person, place, and time. Mental status is at baseline.     Gait: Gait normal.  Psychiatric:        Mood and Affect: Mood normal.        Behavior: Behavior normal.        Thought Content: Thought content normal.       Last depression screening scores    01/06/2022   10:18 AM 08/13/2021    1:09 PM 07/26/2020    9:28 AM  PHQ 2/9 Scores  PHQ - 2 Score 0 1 0  PHQ- 9 Score 0 8 1   Last fall risk screening    01/06/2022   10:17 AM  Fall Risk   Falls in the past year? 0  Number falls in past yr: 0  Injury with Fall? 0  Risk for fall due to : No Fall Risks  Follow up Falls evaluation completed   Last Audit-C alcohol use screening    08/13/2021    1:10 PM  Alcohol Use Disorder Test (AUDIT)  1. How often do you have a drink containing alcohol? 4  2. How many drinks containing alcohol do you have on a typical day when you are drinking? 0  3. How often do you have six or more drinks on one occasion? 2  AUDIT-C Score 6  4. How often during the last year have you found that you were not able to stop drinking once you had started? 1  5. How often during the last year have you failed to do what was normally expected from you because of drinking? 0  6. How often during the last year have you needed a first drink in the morning to get yourself going after a heavy drinking session? 0  7. How often during the last year have you had a feeling of guilt of remorse after drinking? 2  8. How often during the last year have you been unable to remember what happened the night before because you had been drinking? 0  9. Have you or someone  else been injured as a result of your drinking? 0  10. Has a relative or friend or a doctor or another health worker been concerned about your drinking or suggested you cut down? 4  Alcohol Use Disorder Identification Test Final Score (AUDIT) 13   A score of 3 or more in women, and 4 or more in men indicates increased risk for alcohol abuse, EXCEPT if all of the points are from question 1   No results found for any visits on 08/10/22.  Assessment & Plan    Routine Health Maintenance and Physical Exam  Exercise Activities and Dietary recommendations  Goals   None     Immunization History  Administered Date(s) Administered   Tdap 06/25/2009    Health Maintenance  Topic Date Due   DTaP/Tdap/Td (2 - Td or Tdap) 06/26/2019   COVID-19 Vaccine (1 - 2023-24 season) Never done   INFLUENZA VACCINE  08/27/2022   PAP SMEAR-Modifier  10/23/2024   Hepatitis C Screening  Completed   HIV Screening  Completed   HPV VACCINES  Aged Out    Discussed health benefits of physical activity, and encouraged her to engage in regular exercise appropriate for her  age and condition.  Problem List Items Addressed This Visit       Other   Adjustment disorder with depressed mood   Relevant Medications   sertraline (ZOLOFT) 100 MG tablet   Postnasal drip   Other Visit Diagnoses     Encounter for annual physical exam    -  Primary   Relevant Orders   MM 3D SCREENING MAMMOGRAM BILATERAL BREAST   CBC with Differential/Platelet   Comprehensive metabolic panel   Lipid panel   Breast cancer screening by mammogram       Relevant Orders   MM 3D SCREENING MAMMOGRAM BILATERAL BREAST           Intermittent Sore Throat: Likely postnasal drip given the pattern of symptoms and the presence of morning phlegm. No signs of acute infection on examination today. -Recommend over-the-counter Flonase, two sprays each nostril at bedtime to reduce postnasal drip.  Adjustment Disorder: Stable on Sertraline  100mg  daily. Reports good mood and ability to manage stressors. -Continue Sertraline 100mg  daily.  General Health Maintenance / Followup Plans: -Next Pap smear due in 2026. -Order mammogram, patient to schedule at her convenience. -Order labs for cholesterol, kidney and liver function, blood sugar, and blood counts. -Annual physical scheduled for next year. -Continue current medication regimen, pharmacy to manage refills.        Return in about 1 year (around 08/10/2023) for CPE.     I, Shirlee Latch, MD, have reviewed all documentation for this visit. The documentation on 08/10/22 for the exam, diagnosis, procedures, and orders are all accurate and complete.   Kieon Lawhorn, Marzella Schlein, MD, MPH [facility]

## 2022-08-11 LAB — LIPID PANEL
Chol/HDL Ratio: 1.9 ratio (ref 0.0–4.4)
Cholesterol, Total: 231 mg/dL — ABNORMAL HIGH (ref 100–199)
HDL: 123 mg/dL (ref >39)
LDL Chol Calc (NIH): 95 mg/dL (ref 0–99)
Triglycerides: 76 mg/dL (ref 0–149)
VLDL Cholesterol Cal: 13 mg/dL (ref 5–40)

## 2022-08-11 LAB — COMPREHENSIVE METABOLIC PANEL
ALT: 13 IU/L (ref 0–32)
AST: 15 IU/L (ref 0–40)
Albumin: 4.2 g/dL (ref 3.9–4.9)
Alkaline Phosphatase: 92 IU/L (ref 44–121)
BUN/Creatinine Ratio: 19 (ref 9–23)
BUN: 15 mg/dL (ref 6–24)
Bilirubin Total: 0.4 mg/dL (ref 0.0–1.2)
CO2: 22 mmol/L (ref 20–29)
Calcium: 9.5 mg/dL (ref 8.7–10.2)
Chloride: 100 mmol/L (ref 96–106)
Creatinine, Ser: 0.78 mg/dL (ref 0.57–1.00)
Globulin, Total: 2.4 g/dL (ref 1.5–4.5)
Glucose: 79 mg/dL (ref 70–99)
Potassium: 4.4 mmol/L (ref 3.5–5.2)
Sodium: 138 mmol/L (ref 134–144)
Total Protein: 6.6 g/dL (ref 6.0–8.5)
eGFR: 98 mL/min/{1.73_m2} (ref >59)

## 2022-08-11 LAB — CBC WITH DIFFERENTIAL/PLATELET
Basophils Absolute: 0.1 10*3/uL (ref 0.0–0.2)
Basos: 1 %
EOS (ABSOLUTE): 0.1 10*3/uL (ref 0.0–0.4)
Eos: 1 %
Hematocrit: 37.3 % (ref 34.0–46.6)
Hemoglobin: 12.4 g/dL (ref 11.1–15.9)
Immature Grans (Abs): 0 10*3/uL (ref 0.0–0.1)
Immature Granulocytes: 0 %
Lymphocytes Absolute: 2.6 10*3/uL (ref 0.7–3.1)
Lymphs: 42 %
MCH: 29.2 pg (ref 26.6–33.0)
MCHC: 33.2 g/dL (ref 31.5–35.7)
MCV: 88 fL (ref 79–97)
Monocytes Absolute: 0.5 10*3/uL (ref 0.1–0.9)
Monocytes: 8 %
Neutrophils Absolute: 3 10*3/uL (ref 1.4–7.0)
Neutrophils: 48 %
Platelets: 285 10*3/uL (ref 150–450)
RBC: 4.24 x10E6/uL (ref 3.77–5.28)
RDW: 13.2 % (ref 11.7–15.4)
WBC: 6.3 10*3/uL (ref 3.4–10.8)

## 2023-08-10 ENCOUNTER — Ambulatory Visit (INDEPENDENT_AMBULATORY_CARE_PROVIDER_SITE_OTHER): Payer: Self-pay | Admitting: Family Medicine

## 2023-08-10 ENCOUNTER — Encounter: Payer: Self-pay | Admitting: Family Medicine

## 2023-08-10 VITALS — BP 103/62 | HR 77 | Resp 16 | Ht 66.0 in | Wt 124.8 lb

## 2023-08-10 DIAGNOSIS — F4321 Adjustment disorder with depressed mood: Secondary | ICD-10-CM

## 2023-08-10 DIAGNOSIS — Z789 Other specified health status: Secondary | ICD-10-CM | POA: Diagnosis not present

## 2023-08-10 DIAGNOSIS — Z0000 Encounter for general adult medical examination without abnormal findings: Secondary | ICD-10-CM

## 2023-08-10 DIAGNOSIS — Z1231 Encounter for screening mammogram for malignant neoplasm of breast: Secondary | ICD-10-CM | POA: Diagnosis not present

## 2023-08-10 MED ORDER — SERTRALINE HCL 50 MG PO TABS: 75.0000 mg | ORAL_TABLET | Freq: Every day | ORAL | 1 refills | Status: DC

## 2023-08-10 NOTE — Assessment & Plan Note (Signed)
 Depression symptoms well-managed on Zoloft  100 mg. Interested in dose reduction to 75 mg. Discussed potential side effects of missing doses, including 'brain zaps'. - Prescribe Zoloft  50 mg tablets for 75 mg dosing - Instruct to take one and a half 50 mg tablets daily - Monitor for any issues with dose reduction

## 2023-08-10 NOTE — Patient Instructions (Signed)
 Call [facility] to schedule a mammogram [phone]

## 2023-08-10 NOTE — Progress Notes (Signed)
 Complete physical exam   Patient: Beverly Daniels   DOB: October 02, 1980   43 y.o. Female  MRN: [id number] Visit Date: 08/10/2023  Today's healthcare provider: Jon Eva, MD   Chief Complaint  Patient presents with   Annual Exam    Patient feeling well today. She is exercising. Sleeps well.   Subjective    Beverly Daniels is a 43 y.o. female who presents today for a complete physical exam.   Discussed the use of AI scribe software for clinical note transcription with the patient, who gave verbal consent to proceed.  History of Present Illness   Beverly Daniels is a 43 year old female who presents for an annual physical exam.  She is taking Zoloft  100 mg and is considering reducing the dose to 75 mg due to experiencing 'brain zaps' when a dose is missed.  Her menstrual periods have become irregular, with durations now lasting only two days compared to the previous five days. The cycle length remains regular, but the flow varies from heavy to light.  She has not scheduled a mammogram despite previous referrals and has missed calls and voicemails related to scheduling.  She is uncertain about her hepatitis B vaccination status, recalling vaccinations during middle school and college but unsure if hepatitis B was included.  She undergoes routine labs annually, including cholesterol, kidney, and liver function tests.        Last depression screening scores    08/10/2023    2:58 PM 01/06/2022   10:18 AM 08/13/2021    1:09 PM  PHQ 2/9 Scores  PHQ - 2 Score 0 0 1  PHQ- 9 Score  0 8   Last fall risk screening    08/10/2023    2:58 PM  Fall Risk   Falls in the past year? 0  Number falls in past yr: 0  Injury with Fall? 0  Risk for fall due to : No Fall Risks        Medications: Outpatient Medications Prior to Visit  Medication Sig   [DISCONTINUED] sertraline  (ZOLOFT ) 100 MG tablet Take 1 tablet (100 mg total) by mouth daily.   [DISCONTINUED]  fluticasone  (FLONASE ) 50 MCG/ACT nasal spray Place 2 sprays into both nostrils daily. (Patient not taking: Reported on 08/10/2022)   No facility-administered medications prior to visit.    Review of Systems    Objective    BP 103/62 (BP Location: Left Arm, Patient Position: Sitting, Cuff Size: Normal)   Pulse 77   Resp 16   Ht 5' 6 (1.676 m)   Wt 124 lb 12.8 oz (56.6 kg)   LMP 07/29/2023 (Exact Date)   SpO2 100%   Breastfeeding No   BMI 20.14 kg/m    Physical Exam Vitals reviewed.  Constitutional:      General: She is not in acute distress.    Appearance: Normal appearance. She is well-developed. She is not diaphoretic.  HENT:     Head: Normocephalic and atraumatic.     Right Ear: Tympanic membrane, ear canal and external ear normal.     Left Ear: Tympanic membrane, ear canal and external ear normal.     Nose: Nose normal.     Mouth/Throat:     Mouth: Mucous membranes are moist.     Pharynx: Oropharynx is clear. No oropharyngeal exudate.  Eyes:     General: No scleral icterus.    Conjunctiva/sclera: Conjunctivae normal.     Pupils: Pupils are equal, round, and  reactive to light.  Neck:     Thyroid: No thyromegaly.  Cardiovascular:     Rate and Rhythm: Normal rate and regular rhythm.     Heart sounds: Normal heart sounds. No murmur heard. Pulmonary:     Effort: Pulmonary effort is normal. No respiratory distress.     Breath sounds: Normal breath sounds. No wheezing or rales.  Abdominal:     General: There is no distension.     Palpations: Abdomen is soft.     Tenderness: There is no abdominal tenderness.  Musculoskeletal:        General: No deformity.     Cervical back: Neck supple.     Right lower leg: No edema.     Left lower leg: No edema.  Lymphadenopathy:     Cervical: No cervical adenopathy.  Skin:    General: Skin is warm and dry.     Findings: No rash.  Neurological:     Mental Status: She is alert and oriented to person, place, and time. Mental  status is at baseline.     Gait: Gait normal.  Psychiatric:        Mood and Affect: Mood normal.        Behavior: Behavior normal.        Thought Content: Thought content normal.      No results found for any visits on 08/10/23.  Assessment & Plan    Routine Health Maintenance and Physical Exam  Exercise Activities and Dietary recommendations  Goals   None     Immunization History  Administered Date(s) Administered   Tdap 06/25/2009, 09/19/2022    Health Maintenance  Topic Date Due   Hepatitis B Vaccines (1 of 3 - 19+ 3-dose series) Never done   HPV VACCINES (1 - 3-dose SCDM series) Never done   COVID-19 Vaccine (1 - 2024-25 season) Never done   INFLUENZA VACCINE  08/27/2023   Cervical Cancer Screening (HPV/Pap Cotest)  10/23/2024   DTaP/Tdap/Td (3 - Td or Tdap) 09/18/2032   Hepatitis C Screening  Completed   HIV Screening  Completed   Meningococcal B Vaccine  Aged Out    Discussed health benefits of physical activity, and encouraged her to engage in regular exercise appropriate for her age and condition.  Problem List Items Addressed This Visit       Other   Adjustment disorder with depressed mood   Relevant Medications   sertraline  (ZOLOFT ) 50 MG tablet   Other Visit Diagnoses       Encounter for annual physical exam    -  Primary   Relevant Orders   Lipid Panel With LDL/HDL Ratio   Comprehensive metabolic panel with GFR   Hepatitis B Surface AntiBODY   CBC w/Diff/Platelet     Breast cancer screening by mammogram       Relevant Orders   MM 3D SCREENING MAMMOGRAM BILATERAL BREAST     Hepatitis B vaccination status unknown       Relevant Orders   Hepatitis B Surface AntiBODY           Adult Wellness Visit Routine adult wellness visit with focus on immunizations, screenings, and routine labs. - Check hepatitis B immunity with labs - Print mammogram referral and encourage scheduling - Order routine labs for cholesterol, kidney, and liver  function  Perimenopausal symptoms Perimenopausal symptoms with irregular menstrual cycles, including shorter or lighter periods. Hormonal changes expected at this age, typically starting up to ten years before menopause.  General  Health Maintenance Discussed dermatology referral for skin checks due to age and skin type. Recommended establishing care with a dermatologist for annual skin checks. - Refer to dermatologist for annual skin check        Return in about 1 year (around 08/09/2024) for CPE.     Jon Eva, MD  [facility] [phone] (phone) [phone] (fax)  [facility]

## 2023-08-11 LAB — LIPID PANEL WITH LDL/HDL RATIO

## 2023-08-12 ENCOUNTER — Ambulatory Visit: Payer: Self-pay | Admitting: Family Medicine

## 2023-08-12 LAB — HEPATITIS B SURFACE ANTIBODY,QUALITATIVE: Hep B Surface Ab, Qual: NONREACTIVE

## 2023-08-12 LAB — LIPID PANEL WITH LDL/HDL RATIO
Cholesterol, Total: 228 mg/dL — AB (ref 100–199)
HDL: 112 mg/dL (ref >39)
LDL Chol Calc (NIH): 102 mg/dL — AB (ref 0–99)
LDL/HDL Ratio: 0.9 ratio (ref 0.0–3.2)
Triglycerides: 82 mg/dL (ref 0–149)
VLDL Cholesterol Cal: 14 mg/dL (ref 5–40)

## 2023-08-12 LAB — COMPREHENSIVE METABOLIC PANEL WITH GFR
ALT: 14 IU/L (ref 0–32)
AST: 14 IU/L (ref 0–40)
Albumin: 4.4 g/dL (ref 3.9–4.9)
Alkaline Phosphatase: 80 IU/L (ref 44–121)
BUN/Creatinine Ratio: 15 (ref 9–23)
BUN: 13 mg/dL (ref 6–24)
Bilirubin Total: 0.4 mg/dL (ref 0.0–1.2)
CO2: 21 mmol/L (ref 20–29)
Calcium: 9.3 mg/dL (ref 8.7–10.2)
Chloride: 102 mmol/L (ref 96–106)
Creatinine, Ser: 0.85 mg/dL (ref 0.57–1.00)
Globulin, Total: 2.4 g/dL (ref 1.5–4.5)
Glucose: 75 mg/dL (ref 70–99)
Potassium: 3.8 mmol/L (ref 3.5–5.2)
Sodium: 140 mmol/L (ref 134–144)
Total Protein: 6.8 g/dL (ref 6.0–8.5)
eGFR: 88 mL/min/1.73 (ref >59)

## 2023-08-12 LAB — CBC WITH DIFFERENTIAL/PLATELET
Basophils Absolute: 0.1 x10E3/uL (ref 0.0–0.2)
Basos: 1 %
EOS (ABSOLUTE): 0.1 x10E3/uL (ref 0.0–0.4)
Eos: 1 %
Hematocrit: 38 % (ref 34.0–46.6)
Hemoglobin: 12.3 g/dL (ref 11.1–15.9)
Immature Grans (Abs): 0 x10E3/uL (ref 0.0–0.1)
Immature Granulocytes: 0 %
Lymphocytes Absolute: 1.6 x10E3/uL (ref 0.7–3.1)
Lymphs: 24 %
MCH: 30.4 pg (ref 26.6–33.0)
MCHC: 32.4 g/dL (ref 31.5–35.7)
MCV: 94 fL (ref 79–97)
Monocytes Absolute: 0.4 x10E3/uL (ref 0.1–0.9)
Monocytes: 5 %
Neutrophils Absolute: 4.4 x10E3/uL (ref 1.4–7.0)
Neutrophils: 69 %
Platelets: 254 x10E3/uL (ref 150–450)
RBC: 4.05 x10E6/uL (ref 3.77–5.28)
RDW: 12.7 % (ref 11.7–15.4)
WBC: 6.4 x10E3/uL (ref 3.4–10.8)

## 2023-09-28 ENCOUNTER — Encounter

## 2023-10-28 ENCOUNTER — Encounter

## 2023-12-20 ENCOUNTER — Other Ambulatory Visit: Payer: Self-pay

## 2023-12-20 ENCOUNTER — Encounter: Payer: Self-pay | Admitting: Emergency Medicine

## 2023-12-20 DIAGNOSIS — F419 Anxiety disorder, unspecified: Secondary | ICD-10-CM | POA: Insufficient documentation

## 2023-12-20 NOTE — ED Triage Notes (Addendum)
 Pt arrives POV ambulatory to triage, gait steady, no acute distress noted c/o anxiety. Pt reports being on sertraline  for 14 years, gradually taken off it 5 weeks ago, and started back on it 2 days ago due to reoccurring symptoms. Tonight, pt reports increased anxiety, panic, intermittent sweats, upset stomach and diarrhea and pt is unable to sleep tonight. Denies pain anywhere.

## 2023-12-21 ENCOUNTER — Emergency Department
Admission: EM | Admit: 2023-12-21 | Discharge: 2023-12-21 | Disposition: A | Discharge status: Home / Self Care | Payer: Self-pay | Attending: Emergency Medicine | Admitting: Emergency Medicine

## 2023-12-21 ENCOUNTER — Ambulatory Visit: Payer: Self-pay

## 2023-12-21 DIAGNOSIS — F419 Anxiety disorder, unspecified: Secondary | ICD-10-CM

## 2023-12-21 LAB — CBC WITH DIFFERENTIAL/PLATELET
Abs Immature Granulocytes: 0.03 K/uL (ref 0.00–0.07)
Basophils Absolute: 0.1 K/uL (ref 0.0–0.1)
Basophils Relative: 1 %
Eosinophils Absolute: 0.1 K/uL (ref 0.0–0.5)
Eosinophils Relative: 1 %
HCT: 37 % (ref 36.0–46.0)
Hemoglobin: 12.3 g/dL (ref 12.0–15.0)
Immature Granulocytes: 0 %
Lymphocytes Relative: 22 %
Lymphs Abs: 2.1 K/uL (ref 0.7–4.0)
MCH: 29.8 pg (ref 26.0–34.0)
MCHC: 33.2 g/dL (ref 30.0–36.0)
MCV: 89.6 fL (ref 80.0–100.0)
Monocytes Absolute: 0.5 K/uL (ref 0.1–1.0)
Monocytes Relative: 5 %
Neutro Abs: 6.7 K/uL (ref 1.7–7.7)
Neutrophils Relative %: 71 %
Platelets: 278 K/uL (ref 150–400)
RBC: 4.13 MIL/uL (ref 3.87–5.11)
RDW: 12.6 % (ref 11.5–15.5)
WBC: 9.5 K/uL (ref 4.0–10.5)
nRBC: 0 % (ref 0.0–0.2)

## 2023-12-21 LAB — POC URINE PREG, ED: Preg Test, Ur: NEGATIVE

## 2023-12-21 LAB — COMPREHENSIVE METABOLIC PANEL WITH GFR
ALT: 17 U/L (ref 0–44)
AST: 18 U/L (ref 15–41)
Albumin: 4.6 g/dL (ref 3.5–5.0)
Alkaline Phosphatase: 81 U/L (ref 38–126)
Anion gap: 10 (ref 5–15)
BUN: 16 mg/dL (ref 6–20)
CO2: 27 mmol/L (ref 22–32)
Calcium: 9.5 mg/dL (ref 8.9–10.3)
Chloride: 102 mmol/L (ref 98–111)
Creatinine, Ser: 0.72 mg/dL (ref 0.44–1.00)
GFR, Estimated: >60 mL/min (ref >60)
Glucose, Bld: 117 mg/dL — ABNORMAL HIGH (ref 70–99)
Potassium: 3.8 mmol/L (ref 3.5–5.1)
Sodium: 138 mmol/L (ref 135–145)
Total Bilirubin: 0.3 mg/dL (ref 0.0–1.2)
Total Protein: 7.4 g/dL (ref 6.5–8.1)

## 2023-12-21 LAB — HCG, QUANTITATIVE, PREGNANCY: hCG, Beta Chain, Quant, S: <1 m[IU]/mL (ref <5)

## 2023-12-21 LAB — MAGNESIUM: Magnesium: 2.1 mg/dL (ref 1.7–2.4)

## 2023-12-21 LAB — TSH: TSH: 4.13 u[IU]/mL (ref 0.350–4.500)

## 2023-12-21 LAB — CK: Total CK: 54 U/L (ref 38–234)

## 2023-12-21 MED ORDER — CLONAZEPAM 0.5 MG PO TABS: 0.5000 mg | ORAL_TABLET | Freq: Two times a day (BID) | ORAL | 0 refills | Status: DC | PRN

## 2023-12-21 MED ORDER — CLONAZEPAM 0.5 MG PO TABS
0.5000 mg | ORAL_TABLET | Freq: Once | ORAL | Status: AC
  Administered 2023-12-21: 0.5 mg via ORAL
  Filled 2023-12-21: qty 1

## 2023-12-21 NOTE — Telephone Encounter (Signed)
 Reason for Disposition . Patient sounds very upset or troubled to the triager  Answer Assessment - Initial Assessment Questions No available appts with pcp. Called [facility] and call transferred.   Nurse provided [facility] resources: [facility] and [facility] 24 Hour Help line  1. CONCERN: Did anything happen that prompted you to call today?      Anxiety I need to be seen today, I feel like I'm drowning 2. ANXIETY SYMPTOMS: Can you describe how you (your loved one; patient) have been feeling? (e.g., tense, restless, panicky, anxious, keyed up, overwhelmed, sense of impending doom).      Antidepressant; sertraline , July 2025 started decreasing from 100 mg to 75 mg; gradually went down to 25 mg, off med for 5 weeks. Having night panics, then restarted this Sunday at 25mg . Last night, symptoms bad went to ED. 0.5mg  of Klonopin  from ED; not helping 3. ONSET: How long have you been feeling this way? (e.g., hours, days, weeks)     Sunday 4. SEVERITY: How would you rate the level of anxiety? (e.g., 0 - 10; or mild, moderate, severe).     severe 5. FUNCTIONAL IMPAIRMENT: How have these feelings affected your ability to do daily activities? Have you had more difficulty than usual doing your normal daily activities? (e.g., getting better, same, worse; self-care, school, work, interactions)     Can't function 6. HISTORY: Have you felt this way before? Have you ever been diagnosed with an anxiety problem in the past? (e.g., generalized anxiety disorder, panic attacks, PTSD). If Yes, ask: How was this problem treated? (e.g., medicines, counseling, etc.)     Panic attacks 7. RISK OF HARM - SUICIDAL IDEATION: Do you ever have thoughts of hurting or killing yourself? If Yes, ask:  Do you have these feelings now? Do you have a plan on how you would do this?     no 9. THERAPIST: Do you have a counselor or therapist? If Yes, ask: What is their name?     no 11. PATIENT SUPPORT: Who is with you now? Who  do you live with? Do you have family or friends who you can talk to?        husband 32. OTHER SYMPTOMS: Do you have any other symptoms? (e.g., feeling depressed, trouble concentrating, trouble sleeping, trouble breathing, palpitations or fast heartbeat, chest pain, sweating, nausea, or diarrhea)       Nausea, diarrhea, unable to eat, not sleeping, dizziness intermittent Reports normal urine output, yellow urine  Protocols used: Anxiety and Panic Attack-A-AH

## 2023-12-21 NOTE — Telephone Encounter (Signed)
 Called [facility],  Patient off of antidepressants but now having severe panic attacks, unable to eat or drink. Seen in ED last night, but not effective.  Patient requesting to be seen today for symptoms.  States will see if provider will work patient in today.  Dr. Myrla not in office, Dr. Alana Dr. Myrla counterpart advise [facility] walk in clinic today. Transferred call.

## 2023-12-21 NOTE — Telephone Encounter (Signed)
 Copied from CRM [id number]. Topic: Clinical - Red Word Triage >> Dec 21, 2023  9:17 AM Olam RAMAN wrote: Red Word that prompted transfer to Nurse Triage: Pt went to ER last night for anxiety/panic attacks and needed to be seen for her anti depressents  Pt is asking to be seen today and was discharged 11/24  Pt hasn't slept for a couple days or eating and having other symptoms as well. And wants to see if she can have a ref to a psychologist  Patient was transferred to Nurse Triage. Started speaking with patient and then lost connection. Phone call was placed back to patient. No answer. Voicemail left to call back to Nurse triage. Will place in call backs.

## 2023-12-21 NOTE — Telephone Encounter (Signed)
 After speaking with Dr. Franchot, she suggested patient go to the [facility] walk in clinic.    Patient advised by me to go today. Advised her they are open 24 hours.

## 2023-12-21 NOTE — Discharge Instructions (Signed)
 Continue taking the 25 mg of sertraline  until you discuss further with provider.  You do not want to go up on it too fast and so I would just continue with this.  Can take 4 to 6 weeks to take effect.  Take the Klonopin  to help in the meantime with breakthrough anxiety.  Please call your primary care doctor or discussed with [facility] for further evaluation.  Return to the ER if develop thoughts of hurting herself or any other concerns  Do not drive or work while on klonopin .

## 2023-12-21 NOTE — ED Provider Notes (Signed)
 [facility] Provider Note    Event Date/Time   First MD Initiated Contact with Patient 12/21/23 [phone]     (approximate)   History   No chief complaint on file.   HPI  Beverly Daniels is a 43 y.o. female with history of adjustment disorder, anxiety who comes in with concerns for anxiety.  I reviewed note from 08/13/2021 where patient was referred to psychiatry.  Patient reports that she has been on sertraline  for years but she started to come off of it and slowly taper down over a few months.  She has not had any sertraline  for 5 weeks.  She reports that her doctor only knew that she had taper down to 75 mg but did not realize that that she continued to taper it and totally came off of it.  She reports that over the past few days she reported increasing anxiety with difficulty sleeping, fight or flight response, diarrhea.  Denies any chest pain, shortness of breath.  She states that she took a dose of sertraline  yesterday at 25 mg and 1 dose today.  She reports this is the same dosage that she had started on years ago.  However previously when she had started on the sertraline  she had some side effects and was also given some Klonopin  to use as needed until the sertraline  had started helping.  She has not had a Klonopin  prescription in years she reports but reports doing well with it during starting the sertraline  as well as she has had a few doses when she had  delivered her children.  She denies any other concerns such as SI HI, auditory or visual hallucinations.   Physical Exam   Triage Vital Signs: ED Triage Vitals [12/20/23 2343]  Encounter Vitals Group     BP 120/88     Girls Systolic BP Percentile      Girls Diastolic BP Percentile      Boys Systolic BP Percentile      Boys Diastolic BP Percentile      Pulse Rate 89     Resp 18     Temp 97.9 F (36.6 C)     Temp Source Oral     SpO2 100 %     Weight      Height      Head Circumference       Peak Flow      Pain Score 0     Pain Loc      Pain Education      Exclude from Growth Chart     Most recent vital signs: Vitals:   12/20/23 2343 12/21/23 0433  BP: 120/88 132/76  Pulse: 89 64  Resp: 18 16  Temp: 97.9 F (36.6 C) 98.4 F (36.9 C)  SpO2: 100% 100%     General: Awake, no distress.  CV:  Good peripheral perfusion.  Resp:  Normal effort.  Abd:  No distention.  Other:  Patient reports feeling anxious denies SI, HI Patient does have some erythema noted below her eyes but she reports having micro needle done today  ED Results / Procedures / Treatments   Labs (all labs ordered are listed, but only abnormal results are displayed) Labs Reviewed  COMPREHENSIVE METABOLIC PANEL WITH GFR - Abnormal; Notable for the following components:      Result Value   Glucose, Bld 117 (*)    All other components within normal limits  CBC WITH DIFFERENTIAL/PLATELET  MAGNESIUM  CK  TSH  HCG, QUANTITATIVE, PREGNANCY  POC URINE PREG, ED     EKG  My interpretation of EKG:  Normal sinus rate of 67 without any ST elevation or T wave versions, normal intervals   PROCEDURES:  Critical Care performed: No  Procedures   MEDICATIONS ORDERED IN ED: Medications  clonazePAM  (KLONOPIN ) tablet 0.5 mg (has no administration in time range)     IMPRESSION / MDM / ASSESSMENT AND PLAN / ED COURSE  I reviewed the triage vital signs and the nursing notes.   Patient's presentation is most consistent with acute presentation with potential threat to life or bodily function.   Exam seems to be consistent with anxiety.  This does not seem consistent with serotonin syndrome.  She reports being off of all of medications for 4 weeks and only taking 2 doses of 25 mg over the past 48 hours.  I suspect that her symptoms are related to her worsening anxiety and we discussed that sometimes it can take up to 4 to 6 weeks for the serotonin to take effect and she will need to slowly titrate up  with recommendations from psychiatry your primary care doctor but to continue just the 25 mg for now.  We discussed hydroxyzine  for breakthrough anxiety but she reports having significant issues with taking things like that such as Benadryl  and it worsening her symptoms and does not want to try the hydroxyzine .  She does report being on Klonopin  previously.  We discussed that it is addictive and has more side effects and she reports tolerating it well.  I did review the records and see that she did have a prescription back in 2022 .5 mg twice daily as needed.  Therefore given patient does report that she has tolerated this well we discussed doing a 5-day course to help her get to a primary care doctor or [facility] appointment.  Did give her resources for [facility] for follow-up.  She denies any SI, HI we discussed consultation of psychiatry here but patient has no indication for IVC, placement and feels comfortable with trying the Klonopin  and see if this improves her symptoms and declined seeing psych here.  Patient is present with her husband who will drive her home.  Will give a dose of Klonopin  here.  She understands not to drive or work while on this.  Pregnancy test was negative.  CBC reassuring.  CMP reassuring magnesium normal CK normal thyroid  normal    The patient is on the cardiac monitor to evaluate for evidence of arrhythmia and/or significant heart rate changes.      FINAL CLINICAL IMPRESSION(S) / ED DIAGNOSES   Final diagnoses:  Anxiety     Rx / DC Orders   ED Discharge Orders          Ordered    clonazePAM  (KLONOPIN ) 0.5 MG tablet  2 times daily PRN        12/21/23 0503             Note:  This document was prepared using Dragon voice recognition software and may include unintentional dictation errors.   Ernest Ronal BRAVO, MD 12/21/23 [phone]

## 2024-02-07 ENCOUNTER — Encounter: Payer: Self-pay | Admitting: Emergency Medicine

## 2024-02-07 ENCOUNTER — Other Ambulatory Visit: Payer: Self-pay

## 2024-02-07 ENCOUNTER — Ambulatory Visit: Payer: Self-pay | Admitting: *Deleted

## 2024-02-07 ENCOUNTER — Emergency Department
Admission: EM | Admit: 2024-02-07 | Discharge: 2024-02-08 | Disposition: A | Discharge status: Home / Self Care | Attending: Emergency Medicine | Admitting: Emergency Medicine

## 2024-02-07 DIAGNOSIS — F411 Generalized anxiety disorder: Secondary | ICD-10-CM | POA: Diagnosis not present

## 2024-02-07 DIAGNOSIS — F419 Anxiety disorder, unspecified: Secondary | ICD-10-CM | POA: Diagnosis present

## 2024-02-07 LAB — CBC
HCT: 38.1 % (ref 36.0–46.0)
Hemoglobin: 12.5 g/dL (ref 12.0–15.0)
MCH: 29.6 pg (ref 26.0–34.0)
MCHC: 32.8 g/dL (ref 30.0–36.0)
MCV: 90.1 fL (ref 80.0–100.0)
Platelets: 281 K/uL (ref 150–400)
RBC: 4.23 MIL/uL (ref 3.87–5.11)
RDW: 13.3 % (ref 11.5–15.5)
WBC: 6.8 K/uL (ref 4.0–10.5)
nRBC: 0 % (ref 0.0–0.2)

## 2024-02-07 LAB — URINE DRUG SCREEN
Amphetamines: NEGATIVE
Barbiturates: NEGATIVE
Benzodiazepines: NEGATIVE
Cocaine: NEGATIVE
Fentanyl: NEGATIVE
Methadone Scn, Ur: NEGATIVE
Opiates: NEGATIVE
Tetrahydrocannabinol: NEGATIVE

## 2024-02-07 LAB — COMPREHENSIVE METABOLIC PANEL WITH GFR
ALT: 18 U/L (ref 0–44)
AST: 16 U/L (ref 15–41)
Albumin: 4.6 g/dL (ref 3.5–5.0)
Alkaline Phosphatase: 81 U/L (ref 38–126)
Anion gap: 9 (ref 5–15)
BUN: 12 mg/dL (ref 6–20)
CO2: 25 mmol/L (ref 22–32)
Calcium: 9.4 mg/dL (ref 8.9–10.3)
Chloride: 105 mmol/L (ref 98–111)
Creatinine, Ser: 0.78 mg/dL (ref 0.44–1.00)
GFR, Estimated: >60 mL/min
Glucose, Bld: 105 mg/dL — ABNORMAL HIGH (ref 70–99)
Potassium: 3.7 mmol/L (ref 3.5–5.1)
Sodium: 139 mmol/L (ref 135–145)
Total Bilirubin: 0.3 mg/dL (ref 0.0–1.2)
Total Protein: 7.3 g/dL (ref 6.5–8.1)

## 2024-02-07 LAB — ETHANOL: Alcohol, Ethyl (B): <15 mg/dL

## 2024-02-07 LAB — POC URINE PREG, ED: Preg Test, Ur: NEGATIVE

## 2024-02-07 NOTE — ED Notes (Signed)
 Pt reports decreased appetite, not sleeping and feeling anxious for past 4 days.  Pt denies SI or HI.  Pt denies drugs or etoh use.  Pt is calm and cooperative.

## 2024-02-07 NOTE — Telephone Encounter (Signed)
 Pt is at ED. Fyi.

## 2024-02-07 NOTE — ED Notes (Addendum)
 Belongings include- ugg boots, pink socks, brown leggings, white sweater, pink bra, pink underwear, brown puffer coat

## 2024-02-07 NOTE — ED Notes (Signed)
"  Pt ambulated to the bathroom   "

## 2024-02-07 NOTE — ED Provider Notes (Signed)
 "  [facility] Provider Note    None    (approximate)   History   Anxiety and Depression   HPI  Beverly Daniels is a 44 y.o. female who comes in with worsening symptoms of anxiety and depression.  She does have thoughts of hurting herself without any plan.  She reports that thoughts were very brief and they were just because she was feeling really anxious.  She denies any plans.  She states that she has an appoint with her primary care doctor next week but she just needs something to try to help hold her over until she gets there.  She reports that she was going to try to go up on her Zoloft  from 50 to 75 mg and did take 1 dose of that today.  She denies any active SI HI right now she just reports feeling very anxious.  She reports in the past the Klonopin  has worked well.  I did actually saw patient back on 1120 05/2023 and she reports that those 10 pills lasted her 2 months that she really only takes it rarely.  She reports having 4 children at home and having a desire to live and to be present for them and so is hoping to get additional help until she can follow-up with her primary care doctor.     Physical Exam   Triage Vital Signs: ED Triage Vitals [02/07/24 1623]  Encounter Vitals Group     BP 124/74     Girls Systolic BP Percentile      Girls Diastolic BP Percentile      Boys Systolic BP Percentile      Boys Diastolic BP Percentile      Pulse Rate 67     Resp 18     Temp 98.1 F (36.7 C)     Temp Source Oral     SpO2 100 %     Weight 125 lb (56.7 kg)     Height 5' 7 (1.702 m)     Head Circumference      Peak Flow      Pain Score      Pain Loc      Pain Education      Exclude from Growth Chart     Most recent vital signs: Vitals:   02/07/24 1623  BP: 124/74  Pulse: 67  Resp: 18  Temp: 98.1 F (36.7 C)  SpO2: 100%     General: Awake, no distress.  CV:  Good peripheral perfusion.  Resp:  Normal effort.  Abd:  No distention.   Other:  No SI, no HI.   ED Results / Procedures / Treatments   Labs (all labs ordered are listed, but only abnormal results are displayed) Labs Reviewed  COMPREHENSIVE METABOLIC PANEL WITH GFR - Abnormal; Notable for the following components:      Result Value   Glucose, Bld 105 (*)    All other components within normal limits  ETHANOL  CBC  URINE DRUG SCREEN  POC URINE PREG, ED     EKG  My interpretation of EKG:    RADIOLOGY I have reviewed the xray personally and interpreted    PROCEDURES:  Critical Care performed: No  Procedures   MEDICATIONS ORDERED IN ED: Medications - No data to display   IMPRESSION / MDM / ASSESSMENT AND PLAN / ED COURSE  I reviewed the triage vital signs and the nursing notes.   Patient's presentation is most consistent with  acute presentation with potential threat to life or bodily function.    Pt is without any acute medical complaints. No exam findings to suggest medical cause of current presentation. Will order psychiatric screening labs and discuss further w/ psychiatric service.  Patient willing to stay voluntary to discuss with psychiatry.  D/d includes but is not limited to psychiatric disease, behavioral/personality disorder, inadequate socioeconomic support, medical.  Based on HPI, exam, unremarkable labs, no concern for acute medical problem at this time. No rigidity, clonus, hyperthermia, focal neurologic deficit, diaphoresis, tachycardia, meningismus, ataxia, gait abnormality or other finding to suggest this visit represents a non-psychiatric problem. Screening labs reviewed.    Given this, pt medically cleared, to be dispositioned per Psych.    The patient has been placed in psychiatric observation due to the need to provide a safe environment for the patient while obtaining psychiatric consultation and evaluation, as well as ongoing medical and medication management to treat the patient's condition.  The patient has  not been placed under full IVC at this time.   The patient is on the cardiac monitor to evaluate for evidence of arrhythmia and/or significant heart rate changes.      FINAL CLINICAL IMPRESSION(S) / ED DIAGNOSES   Final diagnoses:  Anxiety     Rx / DC Orders   ED Discharge Orders     None        Note:  This document was prepared using Dragon voice recognition software and may include unintentional dictation errors.   Ernest Ronal BRAVO, MD 02/07/24 [phone]  "

## 2024-02-07 NOTE — BH Assessment (Signed)
 Comprehensive Clinical Assessment (CCA) Screening, Triage and Referral Note  02/07/2024 Beverly Daniels [id number] Recommendations for Services/Supports/Treatments: [facility] consult/Disposition pending. Beverly Daniels is an English speaking, Caucasian female. Pt presented to [facility] ED voluntary. Per triage note: Pt taking 50 mg zoloft  and increased to 75 today due to increased depression and anxiety. Pt reports trouble sleeping and not eating for past 3 days. Denies SI.  On assessment, pt. was visibly anxious. Pt was shaking, restless, and had difficulty concentrating. Pt had clear and relevant speech. Pt reported that her heightened anxiety causes her to feel crippled and like her brain is eating itself. Pt reported that she has not been able to sleep or eat properly as she is unable to get her brain to shut off. Pt reported that she has racing and intrusive thoughts of SI which terrifies her. Pt reported getting no sleep hours in the past 24 hours, explaining that she cannot get her brain to shut off. Pt was adamant that she does not have intention or plan to attempt suicide. Pt explained that she just needs a medication adjustment, as she has a pending follow up appointment with her doctor. Pt denied feeling SI/HI/AV/H. Pt's UDS/BAL are unremarkable. The patient is exhibiting heightened sympathetic autonomic nervous system activity and is visibly in distress.  Chief Complaint:  Chief Complaint  Patient presents with   Anxiety   Depression   Visit Diagnosis: GAD  Patient Reported Information How did you hear about us ? No data recorded What Is the Reason for Your Visit/Call Today? No data recorded How Long Has This Been Causing You Problems? No data recorded What Do You Feel Would Help You the Most Today? No data recorded  Have You Recently Had Any Thoughts About Hurting Yourself? No data recorded Are You Planning to Commit Suicide/Harm Yourself At This time? No data recorded  Have you  Recently Had Thoughts About Hurting Someone Sherral? No data recorded Are You Planning to Harm Someone at This Time? No data recorded Explanation: No data recorded  Have You Used Any Alcohol or Drugs in the Past 24 Hours? No data recorded How Long Ago Did You Use Drugs or Alcohol? No data recorded What Did You Use and How Much? No data recorded  Do You Currently Have a Therapist/Psychiatrist? No data recorded Name of Therapist/Psychiatrist: No data recorded  Have You Been Recently Discharged From Any Office Practice or Programs? No data recorded Explanation of Discharge From Practice/Program: No data recorded   CCA Screening Triage Referral Assessment Type of Contact: No data recorded Telemedicine Service Delivery:   Is this Initial or Reassessment?   Date Telepsych consult ordered in [facility]:    Time Telepsych consult ordered in [facility]:    Location of Assessment: No data recorded Provider Location: No data recorded   Collateral Involvement: No data recorded  Does Patient Have a Court Appointed Legal Guardian? No data recorded Name and Contact of Legal Guardian: No data recorded If Minor and Not Living with Parent(s), Who has Custody? No data recorded Is CPS involved or ever been involved? No data recorded Is APS involved or ever been involved? No data recorded  Patient Determined To Be At Risk for Harm To Self or Others Based on Review of Patient Reported Information or Presenting Complaint? No data recorded Method: No data recorded Availability of Means: No data recorded Intent: No data recorded Notification Required: No data recorded Additional Information for Danger to Others Potential: No data recorded Additional Comments for Danger to  Others Potential: No data recorded Are There Guns or Other Weapons in Your Home? No data recorded Types of Guns/Weapons: No data recorded Are These Weapons Safely Secured?                            No data recorded Who Could Verify You Are Able To  Have These Secured: No data recorded Do You Have any Outstanding Charges, Pending Court Dates, Parole/Probation? No data recorded Contacted To Inform of Risk of Harm To Self or Others: No data recorded  Does Patient Present under Involuntary Commitment? No data recorded   [place] of Residence: No data recorded  Patient Currently Receiving the Following Services: No data recorded  Determination of Need: No data recorded  Options For Referral: No data recorded  Disposition Recommendation per psychiatric provider: Pending psych consult  Marwan Lipe R Chyan Carnero, LCAS

## 2024-02-07 NOTE — Telephone Encounter (Signed)
 FYI Only or Action Required?: FYI only for provider: ED advised and unsure if patient will go .  Patient was last seen in primary care on 08/10/2023 by Beverly Jon HERO, MD.  Called Nurse Triage reporting Anxiety.  Symptoms began several days ago.  Interventions attempted: Prescription medications: klonopin , zoloft  .  Symptoms are: gradually worsening.  Triage Disposition: Go to ED Now (or PCP Triage)  Patient/caregiver understands and will follow disposition?: No, wishes to speak with PCP   Patient requesting klonopin  for anxiety and to help with sleep. Patient not sleeping x 3 days , worsening sx of anxiety and depression and thoughts of hurting self. No plan. Recommended ED now. Unsure if patient will go. CAL, [employer].    Gave # to urgent crisis center if patient would like to try this resource since she does not want to go to ED.         Copied from CRM #[id number]. Topic: Clinical - Red Word Triage >> Feb 07, 2024  2:52 PM Roselie BROCKS wrote: Red Word that prompted transfer to Nurse Triage: Patient states she is feeling a lot of anxiety and going into a depressive state. Reason for Disposition  Patient sounds very sick or weak to the triager  Answer Assessment - Initial Assessment Questions Recommended ED. Unsure patient will go due to not wanting to be around flu patients. Patient requesting order for klonopin  as ordered in Nov. From ED. Due to severe report of anxiety advised ED. Patient requesting appt with PCP and none available until Feb. 17. Please advise. CAL [place], notified .       1. CONCERN: Did anything happen that prompted you to call today?      Worsening anxiety and depression 2. ANXIETY SYMPTOMS: Can you describe how you (your loved one; patient) have been feeling? (e.g., tense, restless, panicky, anxious, keyed up, overwhelmed, sense of impending doom).      Intrusive thoughts and causes anxiety , not sleeping  3. ONSET: How long  have you been feeling this way? (e.g., hours, days, weeks)     3 days  4. SEVERITY: How would you rate the level of anxiety? (e.g., 0 - 10; or mild, moderate, severe).     Severe  5. FUNCTIONAL IMPAIRMENT: How have these feelings affected your ability to do daily activities? Have you had more difficulty than usual doing your normal daily activities? (e.g., getting better, same, worse; self-care, school, work, interactions)     Not able to keep routine  6. HISTORY: Have you felt this way before? Have you ever been diagnosed with an anxiety problem in the past? (e.g., generalized anxiety disorder, panic attacks, PTSD). If Yes, ask: How was this problem treated? (e.g., medicines, counseling, etc.)     Zoloft  , went up to 75 mg. 7. RISK OF HARM - SUICIDAL IDEATION: Do you ever have thoughts of hurting or killing yourself? If Yes, ask:  Do you have these feelings now? Do you have a plan on how you would do this?     Hurting self no plan  8. TREATMENT:  What has been done so far to treat this anxiety? (e.g., medicines, relaxation strategies). What has helped?     Trying to increase dose of medication  9. THERAPIST: Do you have a counselor or therapist? If Yes, ask: What is their name?     Na  10. POTENTIAL TRIGGERS: Do you drink caffeinated beverages (e.g., coffee, colas, teas), and how much daily? Do you drink alcohol  or use any drugs? Have you started any new medicines recently?       No eating or drinking much 11. PATIENT SUPPORT: Who is with you now? Who do you live with? Do you have family or friends who you can talk to?        No one with patient now. Lives with family home schools children 12. OTHER SYMPTOMS: Do you have any other symptoms? (e.g., feeling depressed, trouble concentrating, trouble sleeping, trouble breathing, palpitations or fast heartbeat, chest pain, sweating, nausea, or diarrhea)       Feeling anxiety, trouble sleeping, not eating or  drinking. Denies chest pain no difficulty breathing. Thoughts of hurting self but no plan.  13. PREGNANCY: Is there any chance you are pregnant? When was your last menstrual period?       na  Protocols used: Anxiety and Panic Attack-A-AH

## 2024-02-07 NOTE — ED Triage Notes (Signed)
 Pt taking 50 mg zoloft  and increased to 75 today due to increased depression and anxiety. Pt reports trouble sleeping and not eating for past 3 days. Denies SI.

## 2024-02-07 NOTE — ED Notes (Signed)
 Pt's SO took all pt belongings and will keep with him until pt dispo

## 2024-02-07 NOTE — ED Notes (Signed)
 Searched pt and room per RN, Amy request, unable to locate a  phone on the pt or in the pts room.

## 2024-02-08 DIAGNOSIS — F411 Generalized anxiety disorder: Secondary | ICD-10-CM | POA: Diagnosis not present

## 2024-02-08 MED ORDER — HYDROXYZINE HCL 10 MG PO TABS: 10.0000 mg | ORAL_TABLET | Freq: Three times a day (TID) | ORAL | 0 refills | Status: DC | PRN

## 2024-02-08 MED ORDER — CLONAZEPAM 0.5 MG PO TABS: ORAL_TABLET | ORAL | 0 refills | Status: DC

## 2024-02-08 MED ORDER — LORAZEPAM 1 MG PO TABS
1.0000 mg | ORAL_TABLET | Freq: Once | ORAL | Status: DC
  Filled 2024-02-08: qty 1

## 2024-02-08 MED ORDER — CLONAZEPAM 0.5 MG PO TABS
1.0000 mg | ORAL_TABLET | Freq: Once | ORAL | Status: AC
  Administered 2024-02-08: 1 mg via ORAL
  Filled 2024-02-08: qty 2

## 2024-02-08 MED ORDER — CLONAZEPAM 0.5 MG PO TABS
0.5000 mg | ORAL_TABLET | ORAL | Status: AC
  Administered 2024-02-08: 0.5 mg via ORAL
  Filled 2024-02-08: qty 1

## 2024-02-08 NOTE — ED Notes (Signed)
 Pt very anxious.  Md aware  meds ordered

## 2024-02-08 NOTE — ED Notes (Signed)
 Patient sleeping

## 2024-02-08 NOTE — ED Notes (Signed)
Pt ambulated to the bathroom again.

## 2024-02-08 NOTE — ED Notes (Signed)
 Provided pt a pad, pt ambulated to the bathroom again.

## 2024-02-08 NOTE — Consult Note (Signed)
 [facility] Consult Note  Patient Name: Beverly Daniels MRN: [id number] DOB: 04-Jul-1980 DATE OF Consult: 02/08/2024 Consult Order details:  Orders (From admission, onward)     Start     Ordered   02/07/24 1854  CONSULT TO CALL ACT TEAM       Ordering Provider: Ernest Ronal BRAVO, MD  Provider:  (Not yet assigned)  Question:  Reason for Consult?  Answer:  Psych consult   02/07/24 1853   02/07/24 1854  IP CONSULT TO PSYCHIATRY       Ordering Provider: Ernest Ronal BRAVO, MD  Provider:  (Not yet assigned)  Question:  Reason for consult:  Answer:  Medication management   02/07/24 1853            PRIMARY PSYCHIATRIC DIAGNOSES  1.  Generalized Anxiety Disorder, r/o OCD 2.   3.    RECOMMENDATIONS  Recommendations: Medication recommendations: continue zoloft  75mg , can administer qAM dose in ER today.  For anxiety: 1st line-hydroxyzine  10mg  1-3 tabs po q6hrs prn anxiety/insomnia #21 with no refills, 2nd line-klonopin  0.5mg  1-2 tabs po qday prn anxiety #7 with no refills. Non-Medication/therapeutic recommendations: f/u with PCP this week as planned Is inpatient psychiatric hospitalization recommended for this patient? No (Explain why): pt. credibly denies desire to harm self in any way, there is no apparent history of suicide attempts or self-harm, and family is strong barrier to self-harm.  Is another care setting recommended for this patient? (examples may include Crisis Stabilization Unit, Residential/Recovery Treatment, ALF/SNF, Memory Care Unit)  No (Explain why):   From a psychiatric perspective, is this patient appropriate for discharge to an outpatient setting/resource or other less restrictive environment for continued care?  Yes (Explain why): pt. can continue to engage in outpatient f/u with PCP.  She can consider seeing an outpatient psychiatrist as well-would recommend providing local resources. Follow-Up Telepsychiatry C/L services: We will sign off for now. Please re-consult  our service if needed for any concerning changes in the patient's condition, discharge planning, or questions. Communication: Treatment team members (and family members if applicable) who were involved in treatment/care discussions and planning, and with whom we spoke or engaged with via secure text/chat, include the following: nursing staff via video chat  I personally spent a total of 50 minutes in the care of the patient today including preparing to see the patient, getting/reviewing separately obtained history, performing a medically appropriate exam/evaluation, counseling and educating, referring and communicating with other health care professionals, documenting clinical information in the EHR, independently interpreting results, communicating results, and coordinating care.  Thank you for involving us  in the care of this patient. If you have any additional questions or concerns, please call [phone] and ask for me or the provider on-call.  TELEPSYCHIATRY ATTESTATION & CONSENT  As the provider for this telehealth consult, I attest that I verified the patients identity using two separate identifiers, introduced myself to the patient, provided my credentials, disclosed my location, and performed this encounter via a HIPAA-compliant, real-time, face-to-face, two-way, interactive audio and video platform and with the full consent and agreement of the patient (or guardian as applicable.)  Patient physical location: ED at [facility] provider physical location: home office in state of [place]  Video start time: 1031 (Central Time) Video end time: 1101 (Central Time)  IDENTIFYING DATA  Beverly Daniels is a 44 y.o. year-old female for whom a psychiatric consultation has been ordered by the primary provider. The patient was identified using two separate  identifiers.  CHIEF COMPLAINT/REASON FOR CONSULT  Anxiety  HISTORY OF PRESENT ILLNESS (HPI)  The patient is a 44  y/o WF brought in by self last night for increasing anxiety progressing to panic.  She reports that secondary to anxiety she has had insomnia for the last few nights and intrusive depressive thoughts such as wishing she were dead (which disturb her) but she denies intent or plan as well as history of suicidal behavior, citing her children as strong barriers to self-harm.  These issues are chronic but worsened lately without new stressors she can identify.  She reports klonopin  is somewhat helpful-she was in ER in Nov and received 10 klonopin  which she notes she recently ran out of, indicating she's been taking it on average about once per week.  She reports mild hand tremor and nausea she associates with her anxiety as well.  She does not find these symptoms particularly bothersome.  She has restarted her zoloft  and is in the process of titrating up but doesn't feel zoloft  is worsening any anxiety symptoms and notes it's been helpful in the past.  She denies anything suggestive of mania, hypomania, or psychosis, either now or in the past.  She denies S/H ideation or A/V hallucinations and has no other concerns at this time.  PAST PSYCHIATRIC HISTORY  Pt. denies inpatient treatment.  She denies history of suicide attempts or self-mutilation.  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  She took propranolol around age 47 when she started having anxiety but felt it only helped with physical symptoms and not anxious mood.  She notes melatonin is largely ineffective for middle insomnia.  She notes hx of ambien -ineffective, trazodone-not particularly helpful, seroquel prn anxiety-not particularly helpful.  She reports she took prn klonopin  but has been off it for approximately 5 years with the exception of an ER visit in Nov and last night.  Notes partial response for both anxiety and insomnia.  She reports she is currently taking zoloft  again after  weaning off 2 months ago and has been taking 75mg  since yesterday, was taking 50mg  for the last 2 months, approximately, prescribed by PCP.  She plans to return to PCP for medication adjustment later this week.   Otherwise as per HPI above.  PAST MEDICAL HISTORY  Past Medical History:  Diagnosis Date   Anxiety    postpartum    Clotting disorder    History of ITP   Depression    postpartum   Ectopic pregnancy    History of ITP    Age 50 but is no longer present according to patient     HOME MEDICATIONS  Facility Ordered Medications  Medication   [COMPLETED] clonazePAM  (KLONOPIN ) tablet 1 mg   [COMPLETED] clonazePAM  (KLONOPIN ) tablet 0.5 mg   PTA Medications  Medication Sig   sertraline  (ZOLOFT ) 50 MG tablet Take 1.5 tablets (75 mg total) by mouth daily.   clonazePAM  (KLONOPIN ) 0.25 MG disintegrating tablet Take 0.25 mg by mouth 2 (two) times daily as needed (anxiety). (Patient not taking: Reported on 02/07/2024)   No medications noted except for zoloft .    ALLERGIES  Allergies[1] Amoxicillin-causes rash.  SOCIAL & SUBSTANCE USE HISTORY  Social History   Socioeconomic History   Marital status: Married    Spouse name: Not on file   Number of children: 4   Years of education: Not on file   Highest education level: Not on file  Occupational History   Occupation: stay at home mom  Tobacco Use   Smoking status: Never   Smokeless tobacco: Never  Vaping Use   Vaping status: Never Used  Substance and Sexual Activity   Alcohol use: No    Comment: rarely   Drug use: No   Sexual activity: Yes    Partners: Male    Birth control/protection: None  Other Topics Concern   Not on file  Social History Narrative   Not on file   Social Drivers of Health   Tobacco Use: Low Risk (02/07/2024)   Patient History    Smoking Tobacco Use: Never    Smokeless Tobacco Use: Never    Passive Exposure: Not on file  Financial Resource Strain: Not on file  Food Insecurity: Not on file   Transportation Needs: Not on file  Physical Activity: Not on file  Stress: Not on file  Social Connections: Not on file  Depression (PHQ2-9): Low Risk (08/10/2023)   Depression (PHQ2-9)    PHQ-2 Score: 0  Alcohol Screen: Medium Risk (08/13/2021)   Alcohol Screen    Last Alcohol Screening Score (AUDIT): 13  Housing: Not on file  Utilities: Not on file  Health Literacy: Not on file   Tobacco Use History[2] Social History   Substance and Sexual Activity  Alcohol Use No   Comment: rarely   Social History   Substance and Sexual Activity  Drug Use No    Additional pertinent information: reports she lives with her husband and 4 children ages 76, 44, 27, and 5.  Notes her marriage is supportive.  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  Denies substances use of any kind, including tobacco.  Drinks 1-2 cups of coffee in the morning.  FAMILY HISTORY  Family History  Problem Relation Age of Onset   COPD Mother        non smoker   Atrial fibrillation Mother    Macular degeneration Mother    Congestive Heart Failure Maternal Grandmother    Stroke Maternal Grandmother    Skin cancer Maternal Grandmother    Anxiety disorder Paternal Grandmother    Depression Paternal Grandmother    Breast cancer Neg Hx    Colon cancer Neg Hx    Family Psychiatric History (if known):  paternal uncle with depression, committed suicide, grandmothers on both sides had anxiety disorders  MENTAL STATUS EXAM (MSE)  Mental Status Exam: General Appearance: Well Groomed  Orientation:  Full (Time, Place, and Person)  Memory:  Immediate;   Good Recent;   Good Remote;   Good  Concentration:  Concentration: Good and Attention Span: Good  Recall:  Good  Attention  Good  Eye Contact:  Good  Speech:  Clear and Coherent  Language:  Good  Volume:  Normal  Mood: euthymic  Affect:  Full Range  Thought Process:  Coherent  Thought Content:  Logical  Suicidal Thoughts:  No  Homicidal  Thoughts:  No  Judgement:  Good  Insight:  Good  Psychomotor Activity:  Normal  Akathisia:  No  Fund of Knowledge:  Good    Assets:  Communication Skills Desire for Improvement Financial Resources/Insurance Housing Intimacy Physical Health Resilience Social Support Transportation  Cognition:  WNL  ADL's:  Intact  AIMS (if indicated):       VITALS  Blood pressure 111/73, pulse 78, temperature 98.2 F (36.8 C), temperature source Oral, resp. rate 17, height 5' 7 (1.702 m), weight 56.7 kg, last menstrual  period 02/06/2024, SpO2 100%.  LABS  Admission on 02/07/2024  Component Date Value Ref Range Status   Sodium 02/07/2024 139  135 - 145 mmol/L Final   Potassium 02/07/2024 3.7  3.5 - 5.1 mmol/L Final   Chloride 02/07/2024 105  98 - 111 mmol/L Final   CO2 02/07/2024 25  22 - 32 mmol/L Final   Glucose, Bld 02/07/2024 105 (H)  70 - 99 mg/dL Final   Glucose reference range applies only to samples taken after fasting for at least 8 hours.   BUN 02/07/2024 12  6 - 20 mg/dL Final   Creatinine, Ser 02/07/2024 0.78  0.44 - 1.00 mg/dL Final   Calcium 98/87/7973 9.4  8.9 - 10.3 mg/dL Final   Total Protein 98/87/7973 7.3  6.5 - 8.1 g/dL Final   Albumin 98/87/7973 4.6  3.5 - 5.0 g/dL Final   AST 98/87/7973 16  15 - 41 U/L Final   ALT 02/07/2024 18  0 - 44 U/L Final   Alkaline Phosphatase 02/07/2024 81  38 - 126 U/L Final   Total Bilirubin 02/07/2024 0.3  0.0 - 1.2 mg/dL Final   GFR, Estimated 02/07/2024 >60  >60 mL/min Final   Comment: (NOTE) Calculated using the CKD-EPI Creatinine Equation (2021)    Anion gap 02/07/2024 9  5 - 15 Final   Performed at [facility], [address] [place], [place] [address]   Alcohol, Ethyl (B) 02/07/2024 <15  <15 mg/dL Final   Comment: (NOTE) For medical purposes only. Performed at [facility], [address] [place], [place] [address]    WBC 02/07/2024 6.8  4.0 - 10.5 K/uL Final   RBC 02/07/2024 4.23  3.87 - 5.11 MIL/uL  Final   Hemoglobin 02/07/2024 12.5  12.0 - 15.0 g/dL Final   HCT 98/87/7973 38.1  36.0 - 46.0 % Final   MCV 02/07/2024 90.1  80.0 - 100.0 fL Final   MCH 02/07/2024 29.6  26.0 - 34.0 pg Final   MCHC 02/07/2024 32.8  30.0 - 36.0 g/dL Final   RDW 98/87/7973 13.3  11.5 - 15.5 % Final   Platelets 02/07/2024 281  150 - 400 K/uL Final   nRBC 02/07/2024 0.0  0.0 - 0.2 % Final   Performed at [facility], [address] [place], [place] [address]   Opiates 02/07/2024 NEGATIVE  NEGATIVE Final   Cocaine 02/07/2024 NEGATIVE  NEGATIVE Final   Benzodiazepines 02/07/2024 NEGATIVE  NEGATIVE Final   Amphetamines 02/07/2024 NEGATIVE  NEGATIVE Final   Tetrahydrocannabinol 02/07/2024 NEGATIVE  NEGATIVE Final   Barbiturates 02/07/2024 NEGATIVE  NEGATIVE Final   Methadone Scn, Ur 02/07/2024 NEGATIVE  NEGATIVE Final   Fentanyl  02/07/2024 NEGATIVE  NEGATIVE Final   Comment: (NOTE) Drug screen is for Medical Purposes only. Positive results are preliminary only. If confirmation is needed, notify lab within 5 days.  Drug Class                 Cutoff (ng/mL) Amphetamine and metabolites 1000 Barbiturate and metabolites 200 Benzodiazepine              200 Opiates and metabolites     300 Cocaine and metabolites     300 THC                         50 Fentanyl                     5 Methadone  300  Trazodone is metabolized in vivo to several metabolites,  including pharmacologically active m-CPP, which is excreted in the  urine.  Immunoassay screens for amphetamines and MDMA have potential  cross-reactivity with these compounds and may provide false positive  result.  Performed at [facility], [address] [place], [place] [address]    Preg Test, Ur 02/07/2024 Negative  Negative Final    PSYCHIATRIC REVIEW OF SYSTEMS (ROS)  ROS: Notable for the following relevant positive findings: ROS  Additional findings:      Musculoskeletal: No abnormal movements  observed      Gait & Station: Normal      Pain Screening: Denies      Nutrition & Dental Concerns: Decrease in food intake and/or loss of appetite  RISK FORMULATION/ASSESSMENT  Is the patient experiencing any suicidal or homicidal ideations: No        Protective factors considered for safety management: medication response, family support, desire for treatment, children as barrier to self-harm.  Risk factors/concerns considered for safety management:  Family history of suicide  Is there a safety management plan with the patient and treatment team to minimize risk factors and promote protective factors: Yes           Explain: pharmacotherapy, resume outpatient treatment with PCP, restart individual therapy and consider outpatient psychiatry. Is crisis care placement or psychiatric hospitalization recommended: No     Based on my current evaluation and risk assessment, patient is determined at this time to be at:  Low risk  *RISK ASSESSMENT Risk assessment is a dynamic process; it is possible that this patient's condition, and risk level, may change. This should be re-evaluated and managed over time as appropriate. Please re-consult psychiatric consult services if additional assistance is needed in terms of risk assessment and management. If your team decides to discharge this patient, please advise the patient how to best access emergency psychiatric services, or to call 911, if their condition worsens or they feel unsafe in any way.   Bernardino LITTIE Erm, MD Tele[facility]ry Consult Services    [1]  Allergies Allergen Reactions   Amoxicillin Itching, Rash, Swelling and Dermatitis    Swelling on face; itchy all over; rash all over as well  Swelling on face; itchy all over; rash all over as well  Swelling on face; itchy all over; rash all over as well   Cortisone Other (See Comments) and Palpitations    Patient passed out after cortisone shot.  Other reaction(s): Other (See Comments),  Syncope  Passed out after being given a cortisone shot and blood pressure dropped really low.  Patient passed out after cortisone shot.  [2]  Social History Tobacco Use  Smoking Status Never  Smokeless Tobacco Never

## 2024-02-08 NOTE — ED Provider Notes (Signed)
 Emergency Medicine Observation Re-evaluation Note  Beverly Daniels is a 44 y.o. female, seen on rounds today.  Pt initially presented to the ED for complaints of Anxiety and Depression  Currently, the patient is is no acute distress. Denies any concerns at this time.  Physical Exam  Blood pressure 125/71, pulse 79, temperature 98.7 F (37.1 C), temperature source Oral, resp. rate 18, height 5' 7 (1.702 m), weight 56.7 kg, last menstrual period 02/06/2024, SpO2 100%.  Physical Exam: General: No apparent distress Pulm: Normal WOB Neuro: Moving all extremities Psych: Resting comfortably     ED Course / MDM     I have reviewed the labs performed to date as well as medications administered while in observation.  Recent changes in the last 24 hours include: No acute events overnight.  Plan   Current plan: Patient cleared by psychiatry to go back to group home.  Recommended Klonopin  and Atarax  prescription.  Recommended continuing Zoloft . Patient is not under full IVC at this time.    Suzanne Kirsch, MD 02/08/24 1554

## 2024-02-08 NOTE — ED Provider Notes (Signed)
 ER psychiatric observation note  Vitals:   02/07/24 1623 02/08/24 0441  BP: 124/74 111/73  Pulse: 67 78  Resp: 18 17  Temp: 98.1 F (36.7 C) 98.2 F (36.8 C)  SpO2: 100% 100%    Patient is currently asleep.  Even unlabored respirations.  No distress.  Reviewed labs and medications administered.  Current plan is pending psychiatry consultation.   Dicky Anes, MD 02/08/24 [phone]

## 2024-02-08 NOTE — ED Notes (Addendum)
 Pt discharged at this time. RN reviewed discharge instructions with pt. Pt verbalized understanding.  RR even and unlabored. Pt denies any questions or needs at this time. Pt ambulatory at discharge.

## 2024-02-08 NOTE — ED Notes (Signed)
 Meal provided

## 2024-02-08 NOTE — ED Notes (Signed)
 Patient up to bathroom

## 2024-02-08 NOTE — ED Notes (Signed)
 Pt states she needs to speak to someone, states,  I am not doing well, my mind is being tortured. RN, Amy notified and currently speaking with the pt.

## 2024-02-08 NOTE — ED Notes (Signed)
"  Pt ambulated to the bathroom   "

## 2024-02-08 NOTE — Telephone Encounter (Signed)
 Noted

## 2024-02-08 NOTE — ED Notes (Signed)
 Spoke with pt's husband via phone, pt in parking lot in car.  Update given on pt.  Pt aware.

## 2024-02-08 NOTE — ED Notes (Signed)
VOL  pending  consult 

## 2024-02-08 NOTE — ED Notes (Signed)
 Patient in assigned room awake.

## 2024-02-09 ENCOUNTER — Encounter: Payer: Self-pay | Admitting: Family Medicine

## 2024-02-10 NOTE — Telephone Encounter (Signed)
 And patient is aware, right?

## 2024-02-10 NOTE — Telephone Encounter (Signed)
 Please see when the soonest we can get her in is. Thanks!

## 2024-02-15 ENCOUNTER — Ambulatory Visit (INDEPENDENT_AMBULATORY_CARE_PROVIDER_SITE_OTHER): Admitting: Family Medicine

## 2024-02-15 ENCOUNTER — Telehealth: Payer: Self-pay | Admitting: Licensed Clinical Social Worker

## 2024-02-15 ENCOUNTER — Encounter: Payer: Self-pay | Admitting: Family Medicine

## 2024-02-15 VITALS — BP 112/75 | HR 71 | Resp 14 | Ht 67.0 in | Wt 119.1 lb

## 2024-02-15 DIAGNOSIS — F410 Panic disorder [episodic paroxysmal anxiety] without agoraphobia: Secondary | ICD-10-CM | POA: Diagnosis not present

## 2024-02-15 DIAGNOSIS — F411 Generalized anxiety disorder: Secondary | ICD-10-CM | POA: Insufficient documentation

## 2024-02-15 DIAGNOSIS — F4321 Adjustment disorder with depressed mood: Secondary | ICD-10-CM | POA: Diagnosis not present

## 2024-02-15 MED ORDER — SERTRALINE HCL 100 MG PO TABS: 100.0000 mg | ORAL_TABLET | Freq: Every day | ORAL | 1 refills | Status: AC

## 2024-02-15 MED ORDER — CLONAZEPAM 0.5 MG PO TABS: 0.5000 mg | ORAL_TABLET | Freq: Every day | ORAL | 0 refills | Status: AC | PRN

## 2024-02-15 NOTE — Assessment & Plan Note (Signed)
 Chronic generalized anxiety disorder with recent exacerbation leading to panic attacks. Symptoms include insomnia, hypervigilance, and inability to function daily. Recent emergency department visit due to severe anxiety. Current treatment includes Zoloft , hydroxyzine , and clonazepam . Sertraline  dose was reduced and then increased back to 75 mg, but symptoms persisted. Clonazepam  provides temporary relief but is not a long-term solution. Concerns about side effects of increasing sertraline  to 100 mg, but previous experience was positive. Therapy is desired for additional support. - Increased sertraline  to 100 mg daily. - Prescribed clonazepam  for short-term use as needed for anxiety. - Referred to in-clinic therapist for integrated behavioral health support. - Scheduled follow-up appointment in one month to assess progress and adjust treatment as needed.

## 2024-02-15 NOTE — Progress Notes (Signed)
 "     Established patient visit   Patient: Beverly Daniels   DOB: [birth year]   44 y.o. Female  MRN: [id number] Visit Date: 02/15/2024  Today's healthcare provider: Jon Eva, MD   Chief Complaint  Patient presents with   Anxiety    Pt seen at ER 02/07/23 for anxiety f/u   Subjective    HPI HPI     Anxiety    Additional comments: Pt seen at ER 02/07/23 for anxiety f/u      Last edited by Wilfred Hargis RAMAN, CMA on 02/15/2024  8:51 AM.       Discussed the use of AI scribe software for clinical note transcription with the patient, who gave verbal consent to proceed.  History of Present Illness   Beverly Daniels is a 44 year old female with anxiety who presents for follow-up on her anxiety management.  She describes severe anxiety as suffocating and drowning, with 5-minute panic attacks followed by persistent anxiety. Symptoms include shaking, weight loss, and poor oral intake. Severity led to an emergency department visit on February 07, 2024.  She stopped Zoloft  after tapering from 75 mg to 25 mg, which was followed by a resurgence of anxiety. She restarted Zoloft  25 mg before an ED visit in November 2025, then increased to 50 mg and 75 mg, but her anxiety has recently worsened again.  She was prescribed clonazepam  0.5 mg as needed for anxiety. A 0.5 mg dose allows about 3 hours of sleep, while 1 mg allows 6 to 8 hours. She also takes hydroxyzine  10 mg three times daily as needed, but it does not help when her anxiety is severe.  She has significant insomnia that she attributes to both anxiety and Zoloft . Anxiety and poor sleep feed into each other. She previously tried trazodone without benefit and feels hydroxyzine  worsens her anxiety when she cannot sleep.  She wants to establish care with a therapist and have a more structured plan for anxiety management, as she feels unable to manage her symptoms without professional support and has had difficulty accessing  timely, effective care.          02/15/2024    8:49 AM 08/10/2023    2:58 PM 01/06/2022   10:18 AM 08/13/2021    1:09 PM 07/26/2020    9:28 AM  Depression screen PHQ 2/9  Decreased Interest 2 0 0 0 0  Down, Depressed, Hopeless 1 0 0 1 0  PHQ - 2 Score 3 0 0 1 0  Altered sleeping 3  0 1 1  Tired, decreased energy 3  0 1   Change in appetite 3  0 1 0  Feeling bad or failure about yourself  2  0 1 0  Trouble concentrating 2  0 1 0  Moving slowly or fidgety/restless 0  0 2 0  Suicidal thoughts 0  0 0 0  PHQ-9 Score 16  0  8  1   Difficult doing work/chores Extremely dIfficult  Not difficult at all Not difficult at all Not difficult at all     Data saved with a previous flowsheet row definition      02/15/2024    8:49 AM 08/10/2023    2:58 PM 08/13/2021    1:13 PM 07/26/2020    9:41 AM  GAD 7 : Generalized Anxiety Score  Nervous, Anxious, on Edge 3 0  1  1   Control/stop worrying 3 0  1  0   Worry too  much - different things 3 0  0  1   Trouble relaxing 2 0  1  1   Restless 1 0  1  0   Easily annoyed or irritable 1 0  1  1   Afraid - awful might happen 3 0  2  1   Total GAD 7 Score 16 0 7 5  Anxiety Difficulty Extremely difficult Not difficult at all Somewhat difficult Somewhat difficult     Data saved with a previous flowsheet row definition      Medications: Show/hide medication list[1]  Review of Systems     Objective    BP 112/75   Pulse 71   Resp 14   Ht 5' 7 (1.702 m)   Wt 119 lb 1.6 oz (54 kg)   LMP 02/06/2024   SpO2 100%   BMI 18.65 kg/m    Physical Exam Vitals reviewed.  Constitutional:      General: She is not in acute distress.    Appearance: Normal appearance. She is well-developed. She is not diaphoretic.  HENT:     Head: Normocephalic and atraumatic.  Eyes:     General: No scleral icterus.    Conjunctiva/sclera: Conjunctivae normal.  Neck:     Thyroid : No thyromegaly.  Cardiovascular:     Rate and Rhythm: Normal rate and regular rhythm.      Heart sounds: Normal heart sounds. No murmur heard. Pulmonary:     Effort: Pulmonary effort is normal. No respiratory distress.     Breath sounds: Normal breath sounds. No wheezing, rhonchi or rales.  Musculoskeletal:     Cervical back: Neck supple.     Right lower leg: No edema.     Left lower leg: No edema.  Lymphadenopathy:     Cervical: No cervical adenopathy.  Skin:    General: Skin is warm and dry.     Findings: No rash.  Neurological:     Mental Status: She is alert and oriented to person, place, and time. Mental status is at baseline.  Psychiatric:        Mood and Affect: Mood is anxious.        Behavior: Behavior normal.      No results found for any visits on 02/15/24.  Assessment & Plan     Problem List Items Addressed This Visit       Other   GAD (generalized anxiety disorder) - Primary   Chronic generalized anxiety disorder with recent exacerbation leading to panic attacks. Symptoms include insomnia, hypervigilance, and inability to function daily. Recent emergency department visit due to severe anxiety. Current treatment includes Zoloft , hydroxyzine , and clonazepam . Sertraline  dose was reduced and then increased back to 75 mg, but symptoms persisted. Clonazepam  provides temporary relief but is not a long-term solution. Concerns about side effects of increasing sertraline  to 100 mg, but previous experience was positive. Therapy is desired for additional support. - Increased sertraline  to 100 mg daily. - Prescribed clonazepam  for short-term use as needed for anxiety. - Referred to in-clinic therapist for integrated behavioral health support. - Scheduled follow-up appointment in one month to assess progress and adjust treatment as needed.       Relevant Medications   sertraline  (ZOLOFT ) 100 MG tablet   Other Relevant Orders   Ambulatory referral to Integrated Behavioral Health   RESOLVED: Adjustment disorder with depressed mood   Relevant Medications    sertraline  (ZOLOFT ) 100 MG tablet   Other Visit Diagnoses  Panic attack       Relevant Medications   sertraline  (ZOLOFT ) 100 MG tablet       Patient and/or legal guardian verbally consented to [facility] services about presenting concerns and psychiatric consultation as appropriate.  The services will be billed as appropriate for the patient.   Return in about 4 weeks (around 03/14/2024) for MDD/GAD f/u, virtual ok.       Jon Eva, MD  [facility] [phone] (phone) [phone] (fax)  [facility]      [1]  Outpatient Medications Prior to Visit  Medication Sig   [DISCONTINUED] clonazePAM  (KLONOPIN ) 0.5 MG tablet Take 1-2 tabs of 0.5 mg PO once a day as needed for anxiety   [DISCONTINUED] hydrOXYzine  (ATARAX ) 10 MG tablet Take 1 tablet (10 mg total) by mouth 3 (three) times daily as needed.   [DISCONTINUED] sertraline  (ZOLOFT ) 50 MG tablet Take 1.5 tablets (75 mg total) by mouth daily.   No facility-administered medications prior to visit.   "

## 2024-02-15 NOTE — Telephone Encounter (Signed)
 Reached out to pt to schedule IBH visit per Dr. KATHEE. Pt did not answer phone--left HIPAA compliant voice mail.

## 2024-02-21 ENCOUNTER — Inpatient Hospital Stay: Admitting: Family Medicine

## 2024-03-16 ENCOUNTER — Ambulatory Visit: Admitting: Family Medicine

## 2024-08-14 ENCOUNTER — Encounter: Admitting: Family Medicine
